# Patient Record
Sex: Female | Born: 1965 | Race: White | Hispanic: No | Smoking: Current every day smoker
Health system: Southern US, Community
[De-identification: ages and names within clinical notes are randomized; demographics above are authoritative.]

## PROBLEM LIST (undated history)

## (undated) DIAGNOSIS — F17200 Nicotine dependence, unspecified, uncomplicated: Secondary | ICD-10-CM

## (undated) DIAGNOSIS — K219 Gastro-esophageal reflux disease without esophagitis: Secondary | ICD-10-CM

## (undated) DIAGNOSIS — IMO0001 Reserved for inherently not codable concepts without codable children: Secondary | ICD-10-CM

## (undated) DIAGNOSIS — F32A Depression, unspecified: Secondary | ICD-10-CM

## (undated) DIAGNOSIS — N6009 Solitary cyst of unspecified breast: Secondary | ICD-10-CM

## (undated) DIAGNOSIS — N870 Mild cervical dysplasia: Secondary | ICD-10-CM

## (undated) DIAGNOSIS — F329 Major depressive disorder, single episode, unspecified: Secondary | ICD-10-CM

## (undated) HISTORY — DX: Reserved for inherently not codable concepts without codable children: IMO0001

## (undated) HISTORY — DX: Solitary cyst of unspecified breast: N60.09

## (undated) HISTORY — DX: Nicotine dependence, unspecified, uncomplicated: F17.200

## (undated) HISTORY — DX: Depression, unspecified: F32.A

## (undated) HISTORY — DX: Gastro-esophageal reflux disease without esophagitis: K21.9

## (undated) HISTORY — PX: BREAST SURGERY: SHX581

## (undated) HISTORY — DX: Major depressive disorder, single episode, unspecified: F32.9

## (undated) HISTORY — DX: Mild cervical dysplasia: N87.0

---

## 1989-10-21 HISTORY — PX: LAPAROSCOPIC CHOLECYSTECTOMY: SUR755

## 1994-10-21 HISTORY — PX: GANGLION CYST EXCISION: SHX1691

## 1998-12-14 ENCOUNTER — Other Ambulatory Visit: Admission: RE | Admit: 1998-12-14 | Discharge: 1998-12-14 | Payer: Self-pay | Admitting: Obstetrics and Gynecology

## 1999-03-29 ENCOUNTER — Ambulatory Visit (HOSPITAL_COMMUNITY): Admission: RE | Admit: 1999-03-29 | Discharge: 1999-03-29 | Payer: Self-pay | Admitting: *Deleted

## 1999-10-22 DIAGNOSIS — N6009 Solitary cyst of unspecified breast: Secondary | ICD-10-CM

## 1999-10-22 HISTORY — DX: Solitary cyst of unspecified breast: N60.09

## 1999-11-27 ENCOUNTER — Other Ambulatory Visit: Admission: RE | Admit: 1999-11-27 | Discharge: 1999-11-27 | Payer: Self-pay | Admitting: Obstetrics and Gynecology

## 2000-02-08 ENCOUNTER — Encounter: Admission: RE | Admit: 2000-02-08 | Discharge: 2000-02-08 | Payer: Self-pay | Admitting: Gynecology

## 2000-02-08 ENCOUNTER — Encounter: Payer: Self-pay | Admitting: Gynecology

## 2000-11-28 ENCOUNTER — Other Ambulatory Visit: Admission: RE | Admit: 2000-11-28 | Discharge: 2000-11-28 | Payer: Self-pay | Admitting: Gynecology

## 2001-02-04 ENCOUNTER — Encounter (INDEPENDENT_AMBULATORY_CARE_PROVIDER_SITE_OTHER): Payer: Self-pay

## 2001-02-04 ENCOUNTER — Other Ambulatory Visit: Admission: RE | Admit: 2001-02-04 | Discharge: 2001-02-04 | Payer: Self-pay | Admitting: Gynecology

## 2001-02-11 ENCOUNTER — Ambulatory Visit (HOSPITAL_COMMUNITY): Admission: RE | Admit: 2001-02-11 | Discharge: 2001-02-11 | Payer: Self-pay | Admitting: Gastroenterology

## 2001-02-17 ENCOUNTER — Ambulatory Visit (HOSPITAL_COMMUNITY): Admission: RE | Admit: 2001-02-17 | Discharge: 2001-02-17 | Payer: Self-pay | Admitting: Gastroenterology

## 2001-02-17 ENCOUNTER — Encounter: Payer: Self-pay | Admitting: Gastroenterology

## 2001-06-17 ENCOUNTER — Other Ambulatory Visit: Admission: RE | Admit: 2001-06-17 | Discharge: 2001-06-17 | Payer: Self-pay | Admitting: Gynecology

## 2001-10-01 ENCOUNTER — Other Ambulatory Visit: Admission: RE | Admit: 2001-10-01 | Discharge: 2001-10-01 | Payer: Self-pay | Admitting: Gynecology

## 2002-01-07 ENCOUNTER — Other Ambulatory Visit: Admission: RE | Admit: 2002-01-07 | Discharge: 2002-01-07 | Payer: Self-pay | Admitting: Gynecology

## 2002-01-11 ENCOUNTER — Encounter: Admission: RE | Admit: 2002-01-11 | Discharge: 2002-01-11 | Payer: Self-pay | Admitting: Gynecology

## 2002-01-11 ENCOUNTER — Encounter: Payer: Self-pay | Admitting: Gynecology

## 2002-05-06 ENCOUNTER — Other Ambulatory Visit: Admission: RE | Admit: 2002-05-06 | Discharge: 2002-05-06 | Payer: Self-pay | Admitting: Gynecology

## 2003-11-01 ENCOUNTER — Other Ambulatory Visit: Admission: RE | Admit: 2003-11-01 | Discharge: 2003-11-01 | Payer: Self-pay | Admitting: Gynecology

## 2003-11-03 ENCOUNTER — Encounter: Admission: RE | Admit: 2003-11-03 | Discharge: 2003-11-03 | Payer: Self-pay | Admitting: Gynecology

## 2004-12-26 ENCOUNTER — Encounter: Admission: RE | Admit: 2004-12-26 | Discharge: 2004-12-26 | Payer: Self-pay | Admitting: Gynecology

## 2004-12-26 ENCOUNTER — Other Ambulatory Visit: Admission: RE | Admit: 2004-12-26 | Discharge: 2004-12-26 | Payer: Self-pay | Admitting: Gynecology

## 2005-07-03 ENCOUNTER — Other Ambulatory Visit: Admission: RE | Admit: 2005-07-03 | Discharge: 2005-07-03 | Payer: Self-pay | Admitting: Gynecology

## 2006-01-02 ENCOUNTER — Other Ambulatory Visit: Admission: RE | Admit: 2006-01-02 | Discharge: 2006-01-02 | Payer: Self-pay | Admitting: Gynecology

## 2007-03-02 ENCOUNTER — Other Ambulatory Visit: Admission: RE | Admit: 2007-03-02 | Discharge: 2007-03-02 | Payer: Self-pay | Admitting: Gynecology

## 2007-11-03 ENCOUNTER — Encounter: Admission: RE | Admit: 2007-11-03 | Discharge: 2007-11-03 | Payer: Self-pay | Admitting: Gynecology

## 2007-11-17 ENCOUNTER — Encounter: Admission: RE | Admit: 2007-11-17 | Discharge: 2007-11-17 | Payer: Self-pay | Admitting: Gynecology

## 2008-03-18 ENCOUNTER — Other Ambulatory Visit: Admission: RE | Admit: 2008-03-18 | Discharge: 2008-03-18 | Payer: Self-pay | Admitting: Gynecology

## 2009-04-19 ENCOUNTER — Encounter: Admission: RE | Admit: 2009-04-19 | Discharge: 2009-04-19 | Payer: Self-pay | Admitting: Gynecology

## 2009-05-03 ENCOUNTER — Ambulatory Visit: Payer: Self-pay | Admitting: Women's Health

## 2009-05-03 ENCOUNTER — Other Ambulatory Visit: Admission: RE | Admit: 2009-05-03 | Discharge: 2009-05-03 | Payer: Self-pay | Admitting: Gynecology

## 2009-05-03 ENCOUNTER — Encounter: Payer: Self-pay | Admitting: Women's Health

## 2010-05-16 ENCOUNTER — Ambulatory Visit: Payer: Self-pay | Admitting: Women's Health

## 2010-05-16 ENCOUNTER — Other Ambulatory Visit: Admission: RE | Admit: 2010-05-16 | Discharge: 2010-05-16 | Payer: Self-pay | Admitting: Gynecology

## 2011-03-08 NOTE — Procedures (Signed)
[place]. [facility]  Patient:    Hannah Proctor, Hannah Proctor                        MRN: [id number] Proc. Date: 02/11/01 Adm. Date:  [id number] Attending:  Charna Elizabeth                           Procedure Report  DATE OF BIRTH:  1966-05-26  REFERRING PHYSICIAN:  PROCEDURE PERFORMED:  Esophagogastroduodenoscopy.  ENDOSCOPIST:  Anselmo Rod, M.D.  INSTRUMENT USED:  Olympus video panendoscope.  INDICATIONS FOR PROCEDURE:  The patient is a 45 year old white female with a history of severe reflux, chest pain, epigastric discomfort with worsening of her symptoms the last several months rule out peptic ulcer disease.  The patient is on Prilosec on a regular basis but has intermittent abdominal pain. The patient had a laparoscopic cholecystectomy by Angelia Mould. Derrell Lolling, M.D. in the past.  PREPROCEDURE PREPARATION:  Informed consent was procured from the patient. The patient was fasted for eight hours prior to the procedure.  PREPROCEDURE PHYSICAL:  The patient had stable vital signs.  Neck supple. Chest clear to auscultation.  S1, S2 regular.  Abdomen soft with normal abdominal bowel sounds.  Epigastric tenderness on palpation with guarding, no rebound, no rigidity, no hepatosplenomegaly.  DESCRIPTION OF PROCEDURE:  The patient was placed in left lateral decubitus position and sedated with 70 mg of Demerol and 7 mg of Versed intravenously. Once the patient was adequately sedated and maintained on low-flow oxygen and continuous cardiac monitoring, the Olympus video panendoscope was advanced through the mouthpiece, over the tongue, into the esophagus under direct vision.  The entire esophagus appeared normal without evidence of ring, stricture, masses, lesions, esophagitis or Barretts mucosa.  The scope was then advanced to the stomach.  The entire gastric mucosa and the proximal small bowel appeared normal.  IMPRESSION:  Normal  esophagogastroduodenoscopy.  RECOMMENDATION:  Proceed with a flexible sigmoidoscopy at this time.  Further recommendation made thereafter. DD:  02/11/01 TD:  02/11/01 Job: [id number]

## 2011-03-08 NOTE — Procedures (Signed)
[place]  Patient:    Hannah Proctor, Hannah Proctor                        MRN: [id number] Proc. Date: 02/11/01 Adm. Date:  [id number] Attending:  Charna Elizabeth                           Procedure Report  DATE OF BIRTH:  1966/07/25  REFERRING PHYSICIAN:  PROCEDURE PERFORMED:  Flexible sigmoidoscopy.  ENDOSCOPIST:  Anselmo Rod, M.D.  INSTRUMENT USED:  Olympus video colonoscope.  INDICATIONS FOR PROCEDURE:  Abnormal rectal exam on recent physical, rule out rectal masses, polyps etc.  PREPROCEDURE PREPARATION:  Informed consent was procured from the patient. The patient was fasted for eight hours prior to the procedure and prepped with two Fleets enemas the morning of the procedure.  PREPROCEDURE PHYSICAL:  The patient had stable vital signs.  Neck supple. Chest clear to auscultation.  S1, S2 regular.  Abdomen soft with epigastric tenderness on palpation.  No guarding, no rebound, no rigidity.  Rectal examination was repeated and the mass was palpated again at the tip of the examining finger.  DESCRIPTION OF PROCEDURE:  The patient was placed in the left lateral decubitus position and no additional sedation was used.  Once the patient was adequately sedated and maintained on low-flow oxygen and continuous cardiac monitoring, the Olympus video panendoscope was advanced from the rectum to 50 cm without difficulty.  No masses, polyps, erosions, ulcerations or diverticula were seen.  The patient had small external hemorrhoids and tolerated the procedure well without complication.  IMPRESSION: 1. Extrinsic compression of the rectum. 2. Small external hemorrhoid.  RECOMMENDATIONS:  A CT scan of the abdomen and pelvis will be done to work-up the patients abdominal complaints and abnormal rectal findings.  Further recommendation will be made thereafter.DD:  02/11/01 TD:  02/11/01 Job: [id number]

## 2011-05-23 ENCOUNTER — Encounter: Payer: Self-pay | Admitting: Women's Health

## 2011-05-23 ENCOUNTER — Other Ambulatory Visit (HOSPITAL_COMMUNITY)
Admission: RE | Admit: 2011-05-23 | Discharge: 2011-05-23 | Disposition: A | Discharge status: Home / Self Care | Payer: BC Managed Care – PPO | Source: Ambulatory Visit | Attending: Women's Health | Admitting: Women's Health

## 2011-05-23 ENCOUNTER — Ambulatory Visit (INDEPENDENT_AMBULATORY_CARE_PROVIDER_SITE_OTHER): Payer: Self-pay | Admitting: Women's Health

## 2011-05-23 DIAGNOSIS — Z01419 Encounter for gynecological examination (general) (routine) without abnormal findings: Secondary | ICD-10-CM

## 2011-05-23 DIAGNOSIS — A499 Bacterial infection, unspecified: Secondary | ICD-10-CM

## 2011-05-23 DIAGNOSIS — F419 Anxiety disorder, unspecified: Secondary | ICD-10-CM

## 2011-05-23 DIAGNOSIS — F411 Generalized anxiety disorder: Secondary | ICD-10-CM

## 2011-05-23 DIAGNOSIS — N760 Acute vaginitis: Secondary | ICD-10-CM

## 2011-05-23 DIAGNOSIS — F329 Major depressive disorder, single episode, unspecified: Secondary | ICD-10-CM

## 2011-05-23 MED ORDER — ALPRAZOLAM 0.25 MG PO TABS: 0.2500 mg | ORAL_TABLET | Freq: Every evening | ORAL | Status: AC | PRN

## 2011-05-23 MED ORDER — METRONIDAZOLE 0.75 % VA GEL: 1.0000 | Freq: Two times a day (BID) | VAGINAL | Status: AC

## 2011-05-23 MED ORDER — BUPROPION HCL ER (XL) 150 MG PO TB24: 150.0000 mg | ORAL_TABLET | Freq: Every day | ORAL | Status: DC

## 2011-05-23 NOTE — Progress Notes (Addendum)
Hannah Proctor 07-29-1966 [id number]    History:    The patient presents for annual exam. 45 yo DWF G5P2 BTL having most monthly cycles for 5-7 days. Has had some cycles as close to 21 days. She states the last year her cycles have become a little less predictable but they are at least 21 days from day one to day 1. She still struggles with some anxiety and depression issues. She is currently on Wellbutrin 150 XL daily and wishes to continue. She uses an occasional Xanax 0.25, status only used one prescription in the last year. She is a smoker and she is down to about 15 cigarettes a day and is aware of the hazards of smoking in relationship to health Adopted unknown family hx.  Past medical history, past surgical history, family history and social history were all reviewed and documented in the EPIC chart.   ROS:  A 14 point ROS was performed and pertinent positives and negatives are included in the history.  Exam:  Filed Vitals:   05/23/11 0921  BP: 120/70    General appearance:  Normal Head/Neck:  Normal, without cervical or supraclavicular adenopathy. Thyroid:  Symmetrical, normal in size, without palpable masses or nodularity. Respiratory  Effort:  Normal  Auscultation:  Clear without wheezing or rhonchi Cardiovascular  Auscultation:  Regular rate, without rubs, murmurs or gallops  Edema/varicosities:  Not grossly evident Abdominal  Masses/tenderness:  Soft,nontender, without masses, guarding or rebound.  Liver/spleen:  No organomegaly noted  Hernia:  None appreciated  Occult test:   Skin  Inspection:  Grossly normal  Palpation:  Grossly normal Neurologic/psychiatric  Orientation:  Normal with appropriate conversation.  Mood/affect:  Normal  Genitourinary    Breasts: Examined lying and sitting.     Right: Without masses, retractions, discharge or axillary adenopathy.     Left: Without masses, retractions, discharge or axillary adenopathy.   Inguinal/mons:  Normal  without inguinal adenopathy  External genitalia:  Normal  BUS/Urethra/Skene's glands:  Normal  Bladder:  Normal  Vagina:  Normal  Cervix:  Normal-menses present  Uterus:  normal in size, shape and contour.  Midline and mobile  Adnexa/parametria:     Rt: Without masses or tenderness.   Lt: Without masses or tenderness.  Anus and perineum: Normal  Digital rectal exam: Normal sphincter tone without palpated masses or tenderness  Assessment/Plan:  45 y.o. year old female for annual exam.   SBE is, yearly mammogram which is overdue, status we'll schedule. Reviewed importance of quitting cutting back cigarettes, and she is aware. Calcium rich diet encourage, vitamin D 2000 daily encouraged she states she had labs at work -   Cholesterol, glucose, CBC she states they were all normal. Will have these  faxed to our office. Pap only today. Reviewed if cycles became closer than 21 days between to call or return to the office. Reviewed importance of leisure, is dealing with 2 teenage sons. Will continue on Wellbutrin 150 prescription was given, and prescription for Xanax 0.25 to use as needed for rest. She is aware of addictive properties of Xanax.  She has a history of BV in the past, she states it has been better. States she does get vaginal odor after her cycle.She has used MetroGel in the past and would like a prescription to use after her cycle - one applicator at bedtime after cycle. Prescription was given and reviewed to call if this does not prevent or help  the problem.    Keishawn Rajewski JNP,  10:32  AM 05/23/2011

## 2011-09-21 ENCOUNTER — Ambulatory Visit (HOSPITAL_COMMUNITY)
Admission: RE | Admit: 2011-09-21 | Discharge: 2011-09-21 | Disposition: A | Discharge status: Home / Self Care | Payer: BC Managed Care – PPO | Source: Ambulatory Visit | Attending: Family Medicine | Admitting: Family Medicine

## 2011-09-21 ENCOUNTER — Other Ambulatory Visit: Payer: Self-pay | Admitting: Family Medicine

## 2011-09-21 DIAGNOSIS — T1490XA Injury, unspecified, initial encounter: Secondary | ICD-10-CM

## 2011-09-21 DIAGNOSIS — W19XXXA Unspecified fall, initial encounter: Secondary | ICD-10-CM | POA: Insufficient documentation

## 2011-09-21 DIAGNOSIS — R220 Localized swelling, mass and lump, head: Secondary | ICD-10-CM | POA: Insufficient documentation

## 2011-09-21 DIAGNOSIS — S0510XA Contusion of eyeball and orbital tissues, unspecified eye, initial encounter: Secondary | ICD-10-CM | POA: Insufficient documentation

## 2011-09-21 DIAGNOSIS — R42 Dizziness and giddiness: Secondary | ICD-10-CM | POA: Insufficient documentation

## 2011-09-27 ENCOUNTER — Ambulatory Visit (INDEPENDENT_AMBULATORY_CARE_PROVIDER_SITE_OTHER): Payer: BC Managed Care – PPO

## 2011-09-27 DIAGNOSIS — Z4802 Encounter for removal of sutures: Secondary | ICD-10-CM

## 2011-12-13 ENCOUNTER — Encounter: Payer: Self-pay | Admitting: Women's Health

## 2011-12-13 ENCOUNTER — Ambulatory Visit (INDEPENDENT_AMBULATORY_CARE_PROVIDER_SITE_OTHER): Payer: BC Managed Care – PPO | Admitting: Women's Health

## 2011-12-13 ENCOUNTER — Other Ambulatory Visit: Payer: Self-pay | Admitting: Women's Health

## 2011-12-13 DIAGNOSIS — R300 Dysuria: Secondary | ICD-10-CM

## 2011-12-13 DIAGNOSIS — N899 Noninflammatory disorder of vagina, unspecified: Secondary | ICD-10-CM

## 2011-12-13 DIAGNOSIS — N898 Other specified noninflammatory disorders of vagina: Secondary | ICD-10-CM

## 2011-12-13 LAB — URINALYSIS W MICROSCOPIC + REFLEX CULTURE
Bilirubin Urine: NEGATIVE
Casts: NONE SEEN
Glucose, UA: NEGATIVE mg/dL
Leukocytes, UA: NEGATIVE
Urobilinogen, UA: 0.2 mg/dL (ref 0.0–1.0)

## 2011-12-13 LAB — WET PREP FOR TRICH, YEAST, CLUE
Trich, Wet Prep: NONE SEEN
WBC, Wet Prep HPF POC: NONE SEEN
Yeast Wet Prep HPF POC: NONE SEEN

## 2011-12-13 MED ORDER — METRONIDAZOLE 0.75 % VA GEL: VAGINAL | Status: AC

## 2011-12-13 MED ORDER — NITROFURANTOIN MONOHYD MACRO 100 MG PO CAPS: 100.0000 mg | ORAL_CAPSULE | Freq: Two times a day (BID) | ORAL | Status: AC

## 2011-12-13 NOTE — Patient Instructions (Signed)
Bacterial Vaginosis Bacterial vaginosis (BV) is a vaginal infection where the normal balance of bacteria in the vagina is disrupted. The normal balance is then replaced by an overgrowth of certain bacteria. There are several different kinds of bacteria that can cause BV. BV is the most common vaginal infection in women of childbearing age. CAUSES   The cause of BV is not fully understood. BV develops when there is an increase or imbalance of harmful bacteria.   Some activities or behaviors can upset the normal balance of bacteria in the vagina and put women at increased risk including:   Having a new sex partner or multiple sex partners.   Douching.   Using an intrauterine device (IUD) for contraception.   It is not clear what role sexual activity plays in the development of BV. However, women that have never had sexual intercourse are rarely infected with BV.  Women do not get BV from toilet seats, bedding, swimming pools or from touching objects around them.  SYMPTOMS   Grey vaginal discharge.   A fish-like odor with discharge, especially after sexual intercourse.   Itching or burning of the vagina and vulva.   Burning or pain with urination.   Some women have no signs or symptoms at all.  DIAGNOSIS  Your caregiver must examine the vagina for signs of BV. Your caregiver will perform lab tests and look at the sample of vaginal fluid through a microscope. They will look for bacteria and abnormal cells (clue cells), a pH test higher than 4.5, and a positive amine test all associated with BV.  RISKS AND COMPLICATIONS   Pelvic inflammatory disease (PID).   Infections following gynecology surgery.   Developing HIV.   Developing herpes virus.  TREATMENT  Sometimes BV will clear up without treatment. However, all women with symptoms of BV should be treated to avoid complications, especially if gynecology surgery is planned. Female partners generally do not need to be treated. However,  BV may spread between female sex partners so treatment is helpful in preventing a recurrence of BV.   BV may be treated with antibiotics. The antibiotics come in either pill or vaginal cream forms. Either can be used with nonpregnant or pregnant women, but the recommended dosages differ. These antibiotics are not harmful to the baby.   BV can recur after treatment. If this happens, a second round of antibiotics will often be prescribed.   Treatment is important for pregnant women. If not treated, BV can cause a premature delivery, especially for a pregnant woman who had a premature birth in the past. All pregnant women who have symptoms of BV should be checked and treated.   For chronic reoccurrence of BV, treatment with a type of prescribed gel vaginally twice a week is helpful.  HOME CARE INSTRUCTIONS   Finish all medication as directed by your caregiver.   Do not have sex until treatment is completed.   Tell your sexual partner that you have a vaginal infection. They should see their caregiver and be treated if they have problems, such as a mild rash or itching.   Practice safe sex. Use condoms. Only have 1 sex partner.  PREVENTION  Basic prevention steps can help reduce the risk of upsetting the natural balance of bacteria in the vagina and developing BV:  Do not have sexual intercourse (be abstinent).   Do not douche.   Use all of the medicine prescribed for treatment of BV, even if the signs and symptoms go away.     Tell your sex partner if you have BV. That way, they can be treated, if needed, to prevent reoccurrence.  SEEK MEDICAL CARE IF:   Your symptoms are not improving after 3 days of treatment.   You have increased discharge, pain, or fever.  MAKE SURE YOU:   Understand these instructions.   Will watch your condition.   Will get help right away if you are not doing well or get worse.  FOR MORE INFORMATION  Division of STD Prevention (DSTDP), Centers for Disease  Control and Prevention: www.cdc.gov/std American Social Health Association (ASHA): www.ashastd.org  Document Released: 10/07/2005 Document Revised: 06/19/2011 Document Reviewed: 03/30/2009 ExitCare Patient Information 2012 [employer].Urinary Tract Infection Infections of the urinary tract can start in several places. A bladder infection (cystitis), a kidney infection (pyelonephritis), and a prostate infection (prostatitis) are different types of urinary tract infections (UTIs). They usually get better if treated with medicines (antibiotics) that kill germs. Take all the medicine until it is gone. You or your child may feel better in a few days, but TAKE ALL MEDICINE or the infection may not respond and may become more difficult to treat. HOME CARE INSTRUCTIONS   Drink enough water and fluids to keep the urine clear or pale yellow. Cranberry juice is especially recommended, in addition to large amounts of water.   Avoid caffeine, tea, and carbonated beverages. They tend to irritate the bladder.   Alcohol may irritate the prostate.   Only take over-the-counter or prescription medicines for pain, discomfort, or fever as directed by your caregiver.  To prevent further infections:  Empty the bladder often. Avoid holding urine for long periods of time.   After a bowel movement, women should cleanse from front to back. Use each tissue only once.   Empty the bladder before and after sexual intercourse.  FINDING OUT THE RESULTS OF YOUR TEST Not all test results are available during your visit. If your or your child's test results are not back during the visit, make an appointment with your caregiver to find out the results. Do not assume everything is normal if you have not heard from your caregiver or the medical facility. It is important for you to follow up on all test results. SEEK MEDICAL CARE IF:   There is back pain.   Your baby is older than 3 months with a rectal temperature of 100.5  F (38.1 C) or higher for more than 1 day.   Your or your child's problems (symptoms) are no better in 3 days. Return sooner if you or your child is getting worse.  SEEK IMMEDIATE MEDICAL CARE IF:   There is severe back pain or lower abdominal pain.   You or your child develops chills.   You have a fever.   Your baby is older than 3 months with a rectal temperature of 102 F (38.9 C) or higher.   Your baby is 3 months old or younger with a rectal temperature of 100.4 F (38 C) or higher.   There is nausea or vomiting.   There is continued burning or discomfort with urination.  MAKE SURE YOU:   Understand these instructions.   Will watch your condition.   Will get help right away if you are not doing well or get worse.  Document Released: 07/17/2005 Document Revised: 06/19/2011 Document Reviewed: 02/19/2007 ExitCare Patient Information 2012 [employer]. 

## 2011-12-13 NOTE — Progress Notes (Signed)
Patient ID: Hannah Proctor, female   DOB: 20-Mar-1966, 46 y.o.   MRN: [id number] Presents with a complaint of vaginal discharge with odor, especially after intercourse. Pain, burning, at end of stream of urination. Increased urinary urgency and frequency. Denies fever. Same partner. BTL.  Exam: UA 3-6 rbc's, 0-2 WBCs +1 bacteria. No CVAT, abdomen soft, no rebound or radiation of pain. External genitalia slightly erythematous at introitus, speculum exam scant discharge no erythema, positive for odor. Wet prep positive for amines, clue cells, and yeast. Bimanual no CMT or adnexal fullness or tenderness.  BV and yeast UTI  Plan: MetroGel vaginal cream 1 applicator at bedtime x5, Diflucan 150 by mouth x1 dose with refill. Macrobid 1 by mouth twice a day for 7 days instructed to take with food. Urine culture pending. UTI prevention discussed. Call if no relief of symptoms.

## 2011-12-16 LAB — URINE CULTURE

## 2011-12-19 ENCOUNTER — Ambulatory Visit: Payer: BC Managed Care – PPO

## 2011-12-19 ENCOUNTER — Ambulatory Visit (INDEPENDENT_AMBULATORY_CARE_PROVIDER_SITE_OTHER): Payer: BC Managed Care – PPO | Admitting: Family Medicine

## 2011-12-19 VITALS — BP 118/65 | HR 80 | Temp 98.1°F | Resp 16 | Ht 65.0 in | Wt 150.0 lb

## 2011-12-19 DIAGNOSIS — R0989 Other specified symptoms and signs involving the circulatory and respiratory systems: Secondary | ICD-10-CM

## 2011-12-19 DIAGNOSIS — R509 Fever, unspecified: Secondary | ICD-10-CM

## 2011-12-19 DIAGNOSIS — R059 Cough, unspecified: Secondary | ICD-10-CM

## 2011-12-19 DIAGNOSIS — R0602 Shortness of breath: Secondary | ICD-10-CM

## 2011-12-19 DIAGNOSIS — R05 Cough: Secondary | ICD-10-CM

## 2011-12-19 DIAGNOSIS — R079 Chest pain, unspecified: Secondary | ICD-10-CM

## 2011-12-19 MED ORDER — DOXYCYCLINE HYCLATE 100 MG PO TABS: 100.0000 mg | ORAL_TABLET | Freq: Two times a day (BID) | ORAL | Status: AC

## 2011-12-19 MED ORDER — ALBUTEROL SULFATE (2.5 MG/3ML) 0.083% IN NEBU
2.5000 mg | INHALATION_SOLUTION | Freq: Once | RESPIRATORY_TRACT | Status: AC
  Administered 2011-12-19: 2.5 mg via RESPIRATORY_TRACT

## 2011-12-19 MED ORDER — IPRATROPIUM BROMIDE 0.02 % IN SOLN
0.5000 mg | Freq: Once | RESPIRATORY_TRACT | Status: AC
  Administered 2011-12-19: 0.5 mg via RESPIRATORY_TRACT

## 2011-12-19 MED ORDER — IPRATROPIUM-ALBUTEROL 18-103 MCG/ACT IN AERO: 2.0000 | INHALATION_SPRAY | Freq: Four times a day (QID) | RESPIRATORY_TRACT | Status: DC | PRN

## 2011-12-19 NOTE — Progress Notes (Signed)
[facility]:  Office Visit  Chief Complaint:  Chief Complaint  Patient presents with  . Fever    saturday  . Chest Pain    tightness, uri sxs  . Shortness of Breath    HPI: Hannah Proctor is a 46 y.o. female who complains of 5 day history of non-productive cough, fever, chills, + smoker, no Wheeze, no msk pain. Tried OTC meds without relief  Past Medical History  Diagnosis Date  . CIN I (cervical intraepithelial neoplasia I)   . Breast cyst 2001  . Reflux   . Depression   . Smoker    Past Surgical History  Procedure Date  . Laparoscopic cholecystectomy 1991  . Cesarean section 1997  . Ganglion cyst excision 1996   History   Social History  . Marital Status: Divorced    Spouse Name: N/A    Number of Children: N/A  . Years of Education: N/A   Social History Main Topics  . Smoking status: Current Everyday Smoker -- 1.0 packs/day for 30 years    Types: Cigarettes  . Smokeless tobacco: Never Used  . Alcohol Use: Yes  . Drug Use: No  . Sexually Active: None   Other Topics Concern  . None   Social History Narrative  . None   Family History  Problem Relation Age of Onset  . Adopted: Yes   Allergies  Allergen Reactions  . Codeine   . Penicillins    Prior to Admission medications   Medication Sig Start Date End Date Taking? Authorizing Provider  ALPRAZolam (XANAX PO) Take 25 mg by mouth as needed.   Yes Historical Provider, MD  buPROPion (WELLBUTRIN XL) 150 MG 24 hr tablet Take 1 tablet (150 mg total) by mouth daily. 05/23/11  Yes Harrington Challenger, NP  CALCIUM PO Take by mouth.     Yes Historical Provider, MD  metroNIDAZOLE (METROGEL VAGINAL) 0.75 % vaginal gel 1 applicator per vagina at HS x 5 12/13/11 12/20/11 Yes Harrington Challenger, NP  nitrofurantoin, macrocrystal-monohydrate, (MACROBID) 100 MG capsule Take 1 capsule (100 mg total) by mouth 2 (two) times daily. 12/13/11 12/20/11 Yes Harrington Challenger, NP  Omeprazole (PRILOSEC PO) Take by mouth.     Yes  Historical Provider, MD  OVER THE COUNTER MEDICATION     Yes Historical Provider, MD  Probiotic Product (PRO-BIOTIC BLEND) CAPS Take by mouth.     Yes Historical Provider, MD  ranitidine (ZANTAC) 75 MG tablet Take 75 mg by mouth once.     Yes Historical Provider, MD     ROS: The patient denies fevers, chills, night sweats, unintentional weight loss, chest pain, palpitations, wheezing, dyspnea on exertion, nausea, vomiting, abdominal pain, dysuria, hematuria, melena, numbness, weakness, or tingling.+ chest tightness, cough  All other systems have been reviewed and were otherwise negative with the exception of those mentioned in the HPI and as above.    PHYSICAL EXAM: Filed Vitals:   12/19/11 1519  BP: 118/65  Pulse: 80  Temp: 98.1 F (36.7 C)  Resp: 16   Filed Vitals:   12/19/11 1519  Height: 5\' 5"  (1.651 m)  Weight: 150 lb (68.04 kg)   Spo2 97%  Body mass index is 24.96 kg/(m^2).  General: Alert, no acute distress HEENT:  Normocephalic, atraumatic, oropharynx patent.  Cardiovascular:  Regular rate and rhythm, no rubs murmurs or gallops.  No Carotid bruits, radial pulse intact. No pedal edema.  Respiratory: Clear to auscultation bilaterally.  No wheezes, rales, or  rhonchi.  No cyanosis, no use of accessory musculature + decrease air movement, decrease BS,  GI: No organomegaly, abdomen is soft and non-tender, positive bowel sounds.  No masses. Skin: + rash, erythematous macular papular on arms, nonpruritic ( started after she drank alcohol last night while on macrobid-1 day of macrobid left)  Neurologic: Facial musculature symmetric. Psychiatric: Patient is appropriate throughout our interaction. Lymphatic: No cervical lymphadenopathy Musculoskeletal: Gait intact.   LABS:  EKG/XRAY:   Primary read interpreted by Dr. Conley Rolls at [facility]. Right midlobe infiltrate suspicious for PNA.    ASSESSMENT/PLAN: Encounter Diagnoses  Name Primary?  . Cough Yes  . Fever   . Chest congestion     Suspicious for PNA based on sxs and xray reading- Doxycyclin 100 mg BID x 10 days, C/w Mucinex, Combivent Inhaler. F/u prn or if no improvement/worsening sxs in 48-72 hours.  Patient received a neb treatment with Albuterol and Atrovent in office, she felt beter and on exam her WOB was improved, chest motion was not as tight and more audible BS.    Juliette Standre PHUONG, DO 12/19/2011 4:09 PM

## 2012-01-02 ENCOUNTER — Telehealth: Payer: Self-pay | Admitting: Family Medicine

## 2012-01-02 NOTE — Telephone Encounter (Signed)
Called patient on 12/29/11 to ask her to come in for repeat xray in 1-2 weeks. Radiology had recommended repeat xray.

## 2012-06-05 IMAGING — CT CT HEAD W/O CM
3 of 5 series · 16 of 47 positions shown, 19 images · non-contrast
Comparison: None.

CT HEAD

CLINICAL DATA: Fell yesterday, dizziness, periorbital hematoma.

CT HEAD WITHOUT CONTRAST
CT MAXILLOFACIAL WITHOUT CONTRAST
TECHNIQUE: Multidetector CT imaging of the head and maxillofacial
structures were performed using the standard protocol without
intravenous contrast. Multiplanar CT image reconstructions of the
maxillofacial structures were also generated.

[Series 602: coronal st · coronal · 0.30mm/px · 1 of 61 slices shown]
[im 31/61  brain]
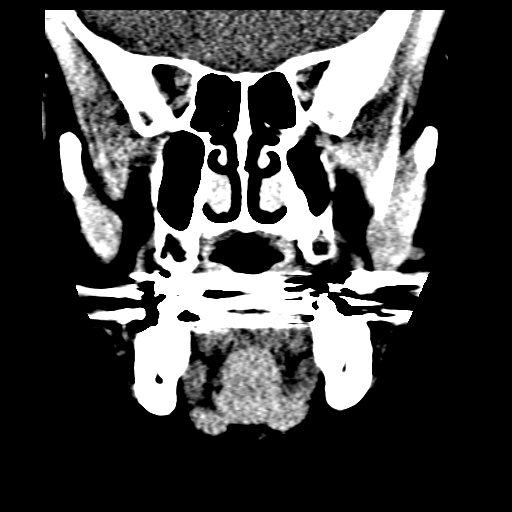

[Series 603: sagittal st · sagittal · 0.30mm/px · 3 of 73 slices shown]
[im 25/73  brain]
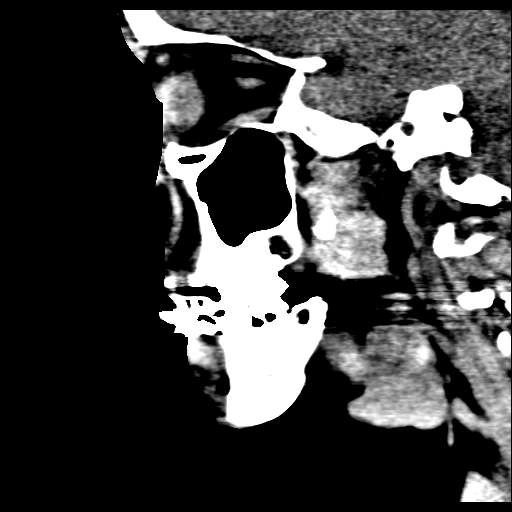
[im 37/73  brain]
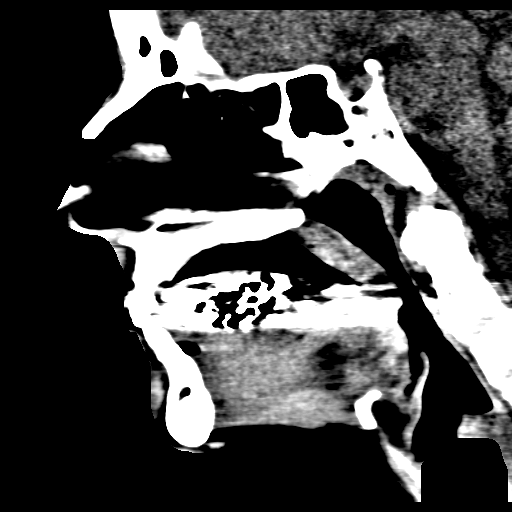
[im 49/73  brain]
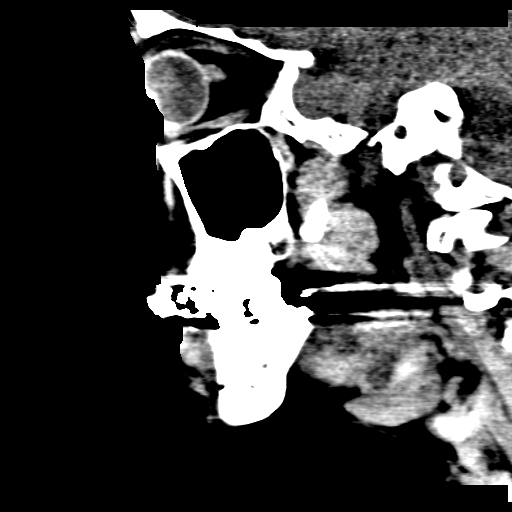

[Series 604: axial st · axial · 0.30mm/px · z∈[-324,-197]mm · 12 of 140 slices shown, 15 images]
[im 7/140  brain]
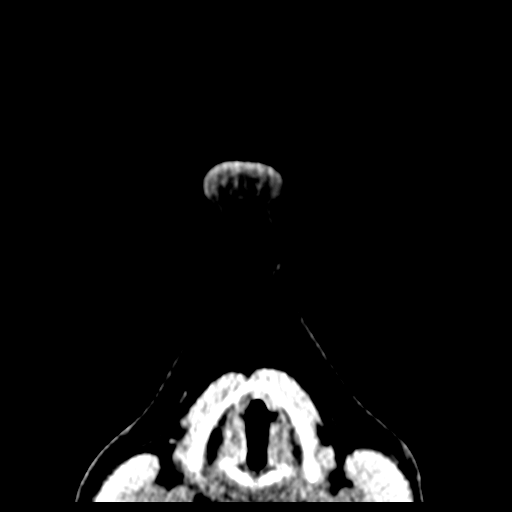
[im 7/140  bone]
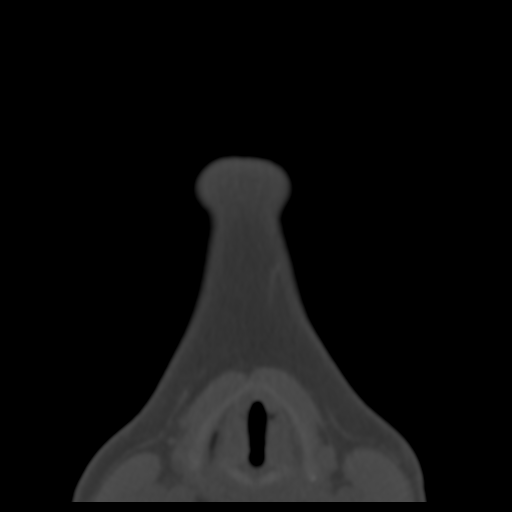
[im 19/140  brain]
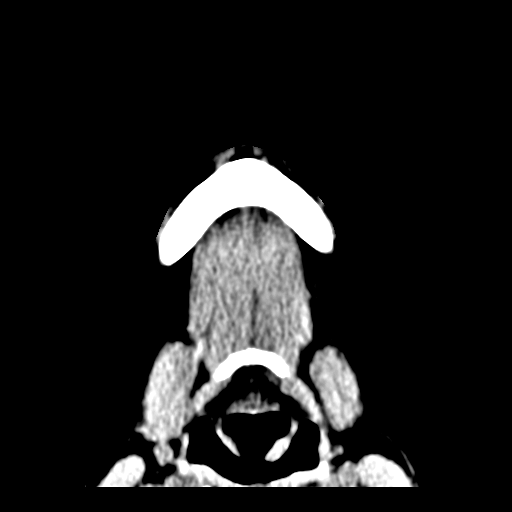
[im 31/140  brain]
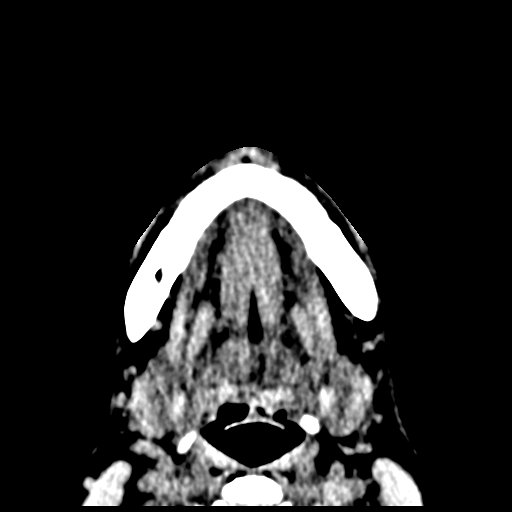
[im 43/140  brain]
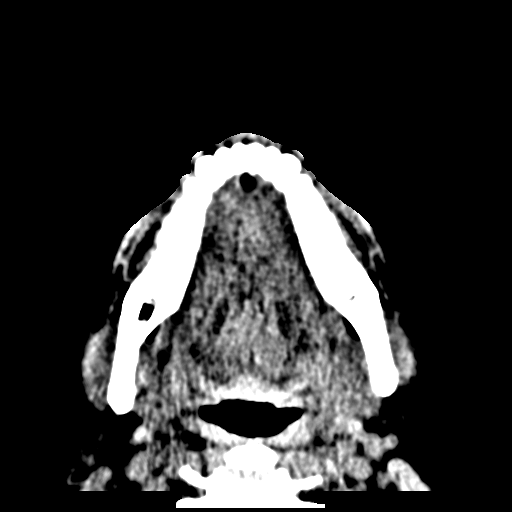
[im 55/140  brain]
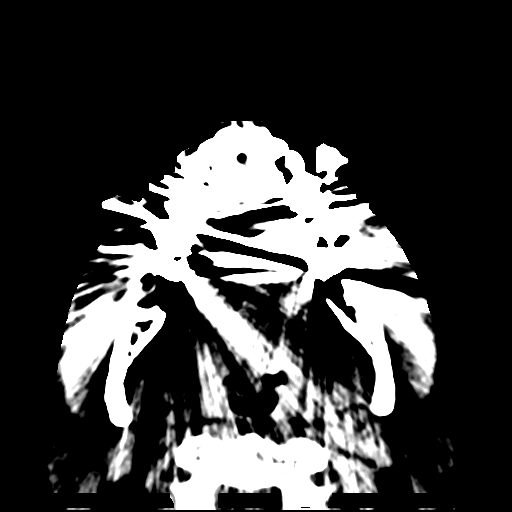
[im 55/140  bone]
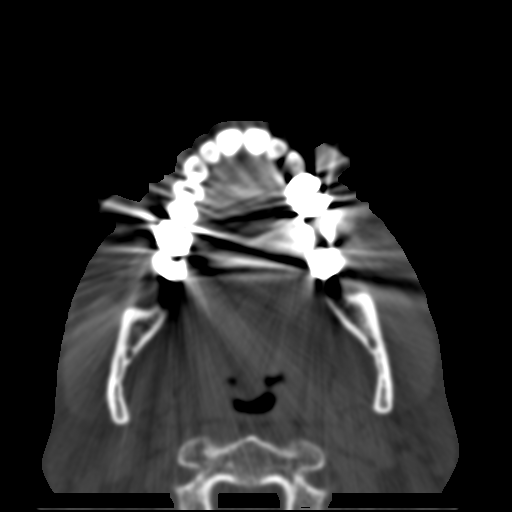
[im 67/140  brain]
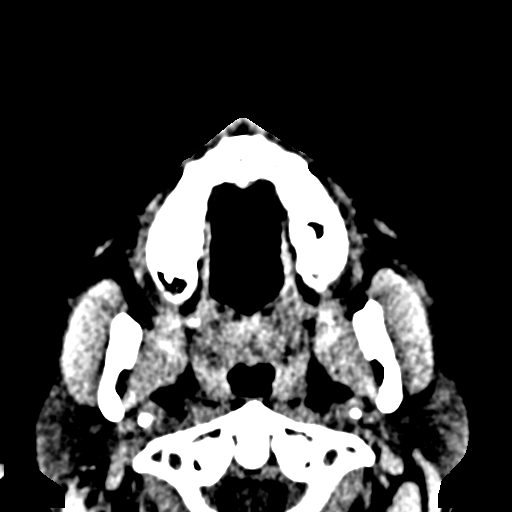
[im 73/140  brain]
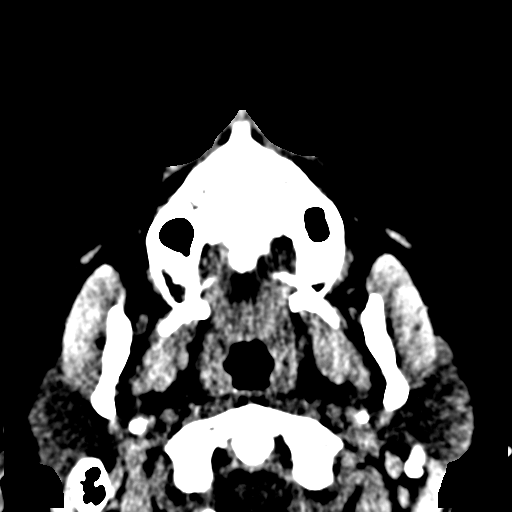
[im 85/140  brain]
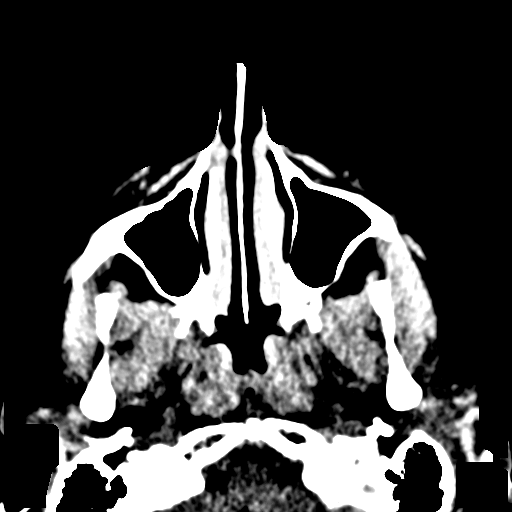
[im 97/140  brain]
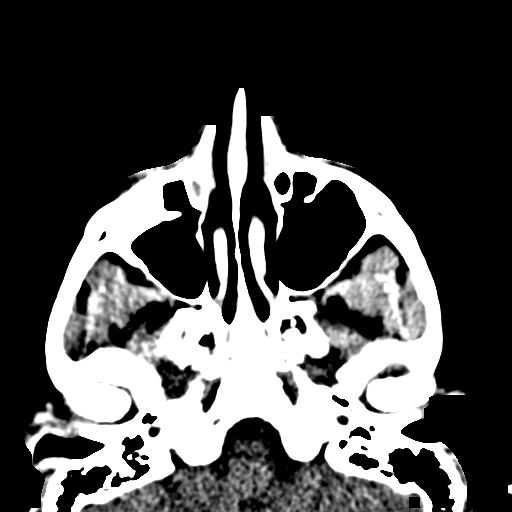
[im 97/140  bone]
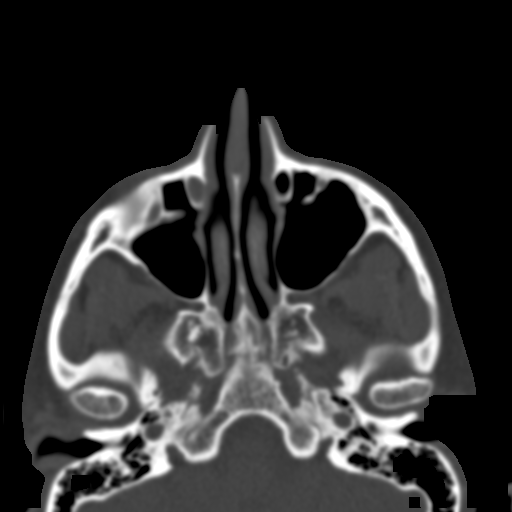
[im 109/140  brain]
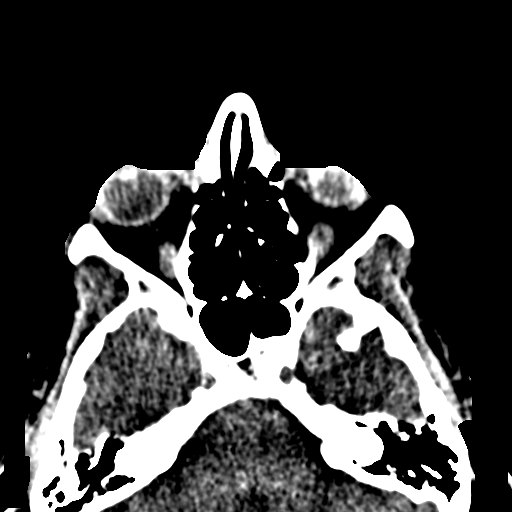
[im 121/140  brain]
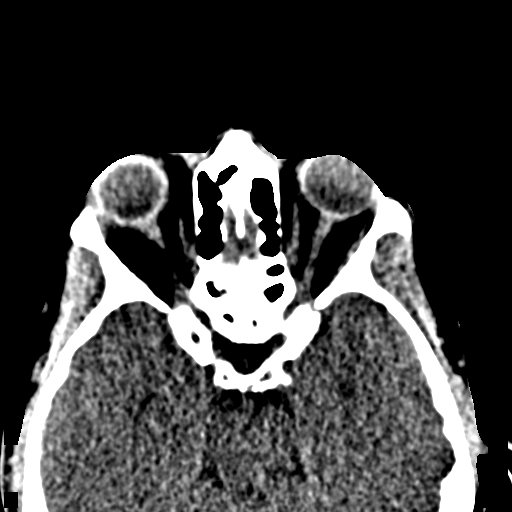
[im 133/140  brain]
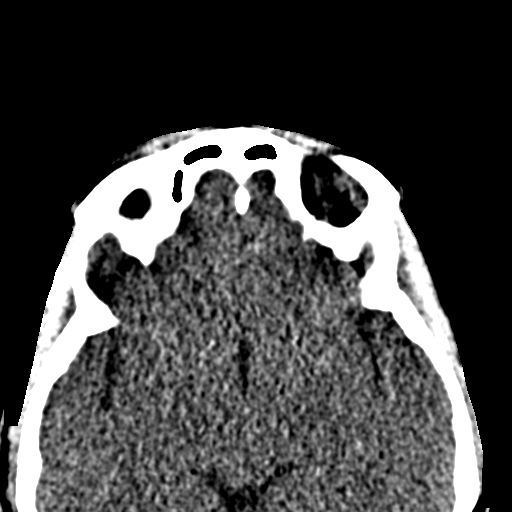

[16 of 47 positions shown; findings below may reference images not displayed]

FINDINGS: There is no evidence for acute infarction, intracranial
hemorrhage, mass lesion, hydrocephalus, or extra-axial fluid.
There is no atrophy or white matter disease.  Calvarium is intact.
The paranasal sinuses and mastoids are clear.
IMPRESSION: Negative.

CT MAXILLOFACIAL
FINDINGS: There is mild preseptal periorbital soft tissue
swelling on the right extending into the right malar region.  There
is no visible facial fracture.  Specifically, with regard to the
right orbit, there is no blowout fracture.  There is no air-fluid
level in the ethmoid or maxillary sinuses.  The sphenoid sinus is
clear.  The nasal bones and nasal septum are intact.  There is no
zygomatic, maxillary or mandibular fracture.  Temporomandibular
joints are located. There is no visible radiopaque foreign body.
There are no visible loose or missing teeth.

Both globes are symmetric.  There is no retrobulbar hemorrhage.
There is no proptosis.  The lens appears normally located
bilaterally.
IMPRESSION: Mild right preseptal periorbital soft tissue swelling without post-
septal orbital hematoma, blowout fracture, disruption of the globe,
or other significant facial injury.  Mild right malar hematoma. No
facial fracture.

## 2012-07-28 ENCOUNTER — Other Ambulatory Visit: Payer: Self-pay | Admitting: Women's Health

## 2012-07-28 DIAGNOSIS — F329 Major depressive disorder, single episode, unspecified: Secondary | ICD-10-CM

## 2012-07-29 NOTE — Telephone Encounter (Signed)
Pt. is due for AEX. Will have front call to get that scheduled.

## 2012-07-29 NOTE — Telephone Encounter (Signed)
Please have patient schedule annual exam.

## 2012-07-29 NOTE — Telephone Encounter (Signed)
Message left to schedule annual exam and rx called in

## 2012-08-05 ENCOUNTER — Ambulatory Visit (INDEPENDENT_AMBULATORY_CARE_PROVIDER_SITE_OTHER): Payer: BC Managed Care – PPO | Admitting: Women's Health

## 2012-08-05 ENCOUNTER — Encounter: Payer: Self-pay | Admitting: Women's Health

## 2012-08-05 VITALS — BP 132/88 | Ht 65.5 in | Wt 141.0 lb

## 2012-08-05 DIAGNOSIS — F3289 Other specified depressive episodes: Secondary | ICD-10-CM

## 2012-08-05 DIAGNOSIS — Z01419 Encounter for gynecological examination (general) (routine) without abnormal findings: Secondary | ICD-10-CM

## 2012-08-05 DIAGNOSIS — F329 Major depressive disorder, single episode, unspecified: Secondary | ICD-10-CM

## 2012-08-05 DIAGNOSIS — Z113 Encounter for screening for infections with a predominantly sexual mode of transmission: Secondary | ICD-10-CM

## 2012-08-05 DIAGNOSIS — F17210 Nicotine dependence, cigarettes, uncomplicated: Secondary | ICD-10-CM

## 2012-08-05 DIAGNOSIS — F17200 Nicotine dependence, unspecified, uncomplicated: Secondary | ICD-10-CM

## 2012-08-05 MED ORDER — ALPRAZOLAM 0.25 MG PO TABS: 0.2500 mg | ORAL_TABLET | Freq: Every evening | ORAL | Status: DC | PRN

## 2012-08-05 MED ORDER — BUPROPION HCL ER (XL) 150 MG PO TB24: 150.0000 mg | ORAL_TABLET | Freq: Every morning | ORAL | Status: DC

## 2012-08-05 NOTE — Progress Notes (Signed)
Hannah Proctor 1966-06-13 [id number]    History:    The patient presents for annual exam.  Brought labs performed at work CBC, lipid panel, liver function tests, TSH all normal, hemoglobin A1c 5.1, FSH 68 has been amenorrheic for 2 months. 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 History of breast cysts, overdue for mammogram, last mammogram 2010. BTL. Smokes one pack per day. History of depression, stable on Wellbutrin.  Past medical history, past surgical history, family history and social history were all reviewed and documented in the EPIC chart. 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 Sons [name] 16, doing well, [name] 18 struggles..   ROS:  A  ROS was performed and pertinent positives and negatives are included in the history.  Exam:  Filed Vitals:   08/05/12 0952  BP: 132/88    General appearance:  Normal Head/Neck:  Normal, without cervical or supraclavicular adenopathy. Thyroid:  Symmetrical, normal in size, without palpable masses or nodularity. Respiratory  Effort:  Normal  Auscultation:  Clear without wheezing or rhonchi Cardiovascular  Auscultation:  Regular rate, without rubs, murmurs or gallops  Edema/varicosities:  Not grossly evident Abdominal  Soft,nontender, without masses, guarding or rebound.  Liver/spleen:  No organomegaly noted  Hernia:  None appreciated  Skin  Inspection:  Grossly normal  Palpation:  Grossly normal Neurologic/psychiatric  Orientation:  Normal with appropriate conversation.  Mood/affect:  Normal  Genitourinary    Breasts: Examined lying and sitting.     Right: Without masses, retractions, discharge or axillary adenopathy.     Left: 2-3 cm mobile nontender probable cyst at 10:00 position/similar to past ultrasounds   Inguinal/mons:  Normal without inguinal adenopathy  External genitalia:  Normal  BUS/Urethra/Skene's glands:  Normal  Bladder:  Normal  Vagina:  Normal  Cervix:  Normal  Uterus:   normal in size, shape  and contour.  Midline and mobile  Adnexa/parametria:     Rt: Without masses or tenderness.   Lt: Without masses or tenderness.  Anus and perineum: Normal  Digital rectal exam: Normal sphincter tone without palpated masses or tenderness  Assessment/Plan:  46 y.o. DW F. G5 P2  for annual exam with no complaints.  Fibrocystic breasts/mammogram overdue Perimenopausal Normal labs at work [facility] Depression stable on Wellbutrin 150  Plan: Amenorrheic x2 months with an elevated FSH. Instructed to call if further bleeding or problematic menopausal symptoms. Encourage condoms if sexually active. SBE's, report changes, reviewed importance of mammogram and encouraged 3D. Continue exercise, calcium rich diet, vitamin D 2000 daily encouraged. Reviewed importance of decreasing or quitting smoking. Wellbutrin 150 by mouth daily prescription, proper use given and reviewed states has done well and would like to continue. Denies need for counseling at this time. Uses than occasional Xanax 0.25, prescription, addictive properties reviewed. GC/Chlamydia culture only declines need for HIV, hepatitis or RPR. Condoms encouraged if become sexually active. No Pap new screening guidelines reviewed, normal Pap 2012.  Carmita Boom J WHNP, 1:10 PM 08/05/2012

## 2012-08-05 NOTE — Patient Instructions (Addendum)

## 2012-08-06 LAB — GC/CHLAMYDIA PROBE AMP, GENITAL
Chlamydia, DNA Probe: NEGATIVE
GC Probe Amp, Genital: NEGATIVE

## 2012-09-02 IMAGING — CR DG CHEST 2V
2 series · 2 of 2 positions shown · non-contrast
Comparison: Chest x-ray of 10/29/2010

CLINICAL DATA: Cough, chest tightness, shortness of breath

CHEST - 2 VIEW

[PA]
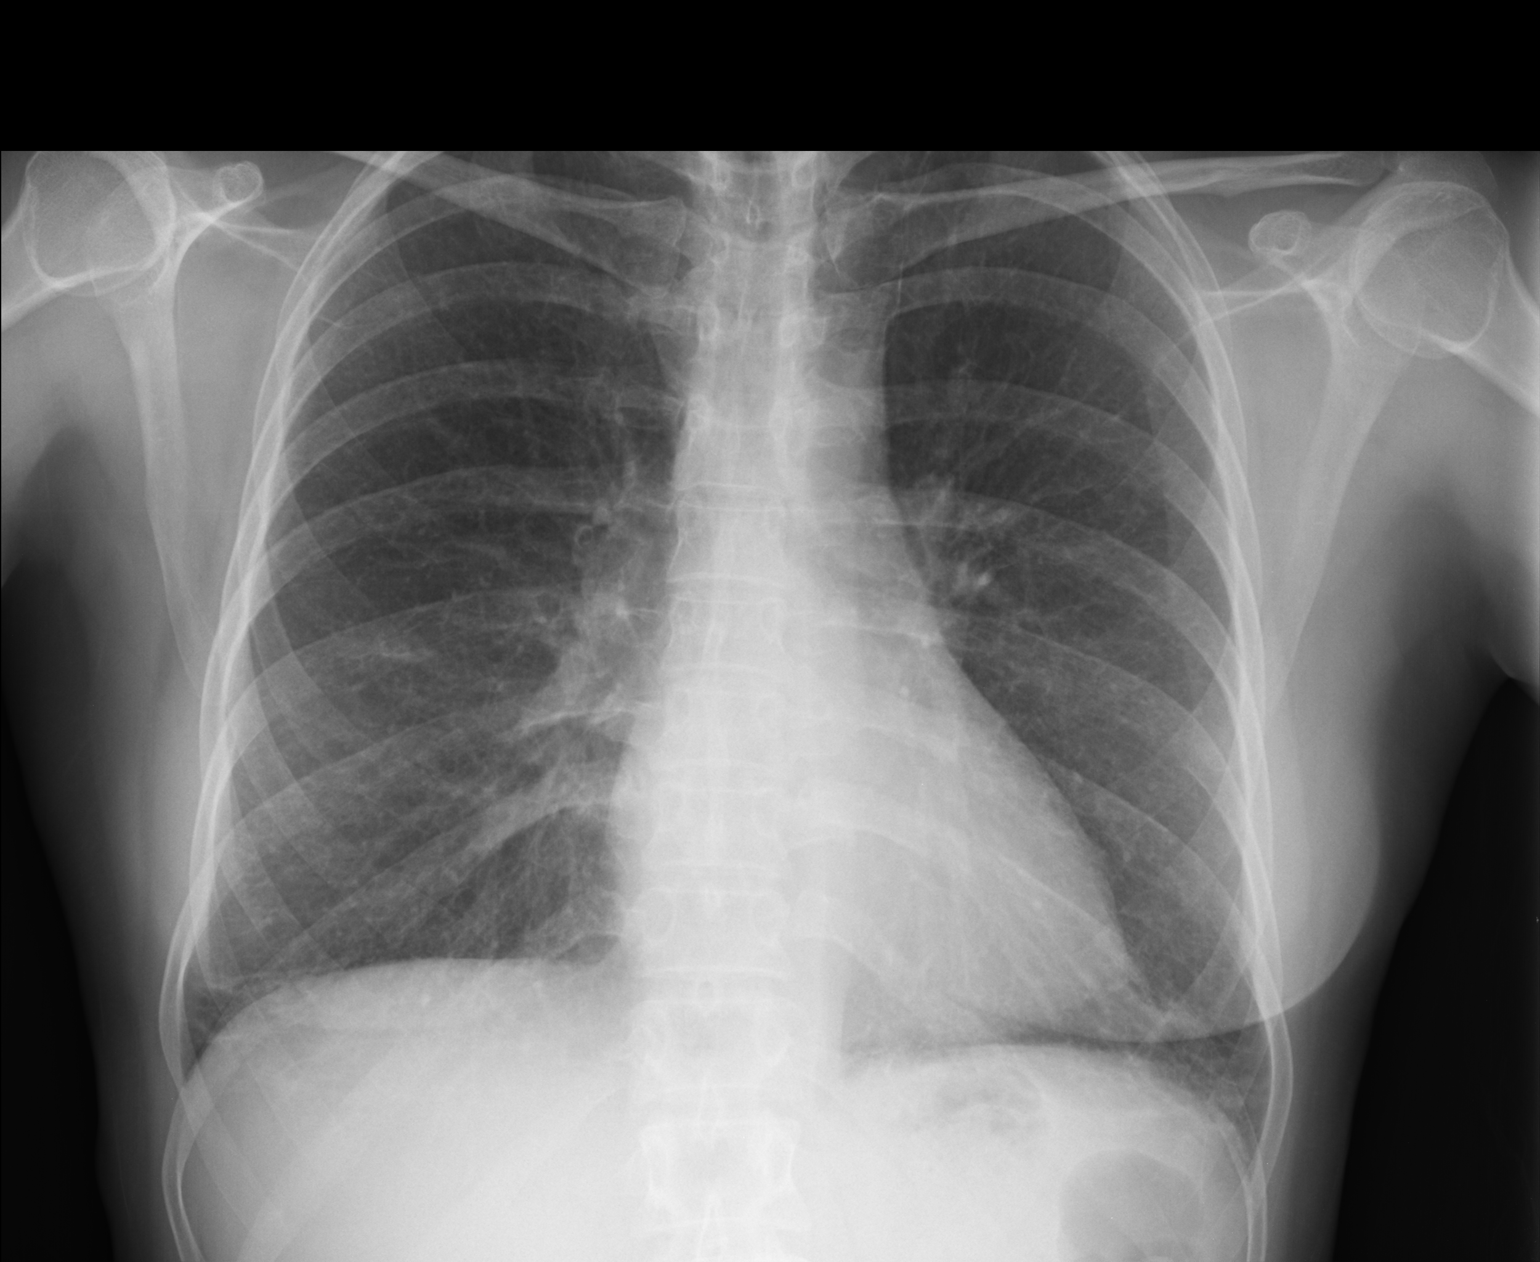

[lateral]
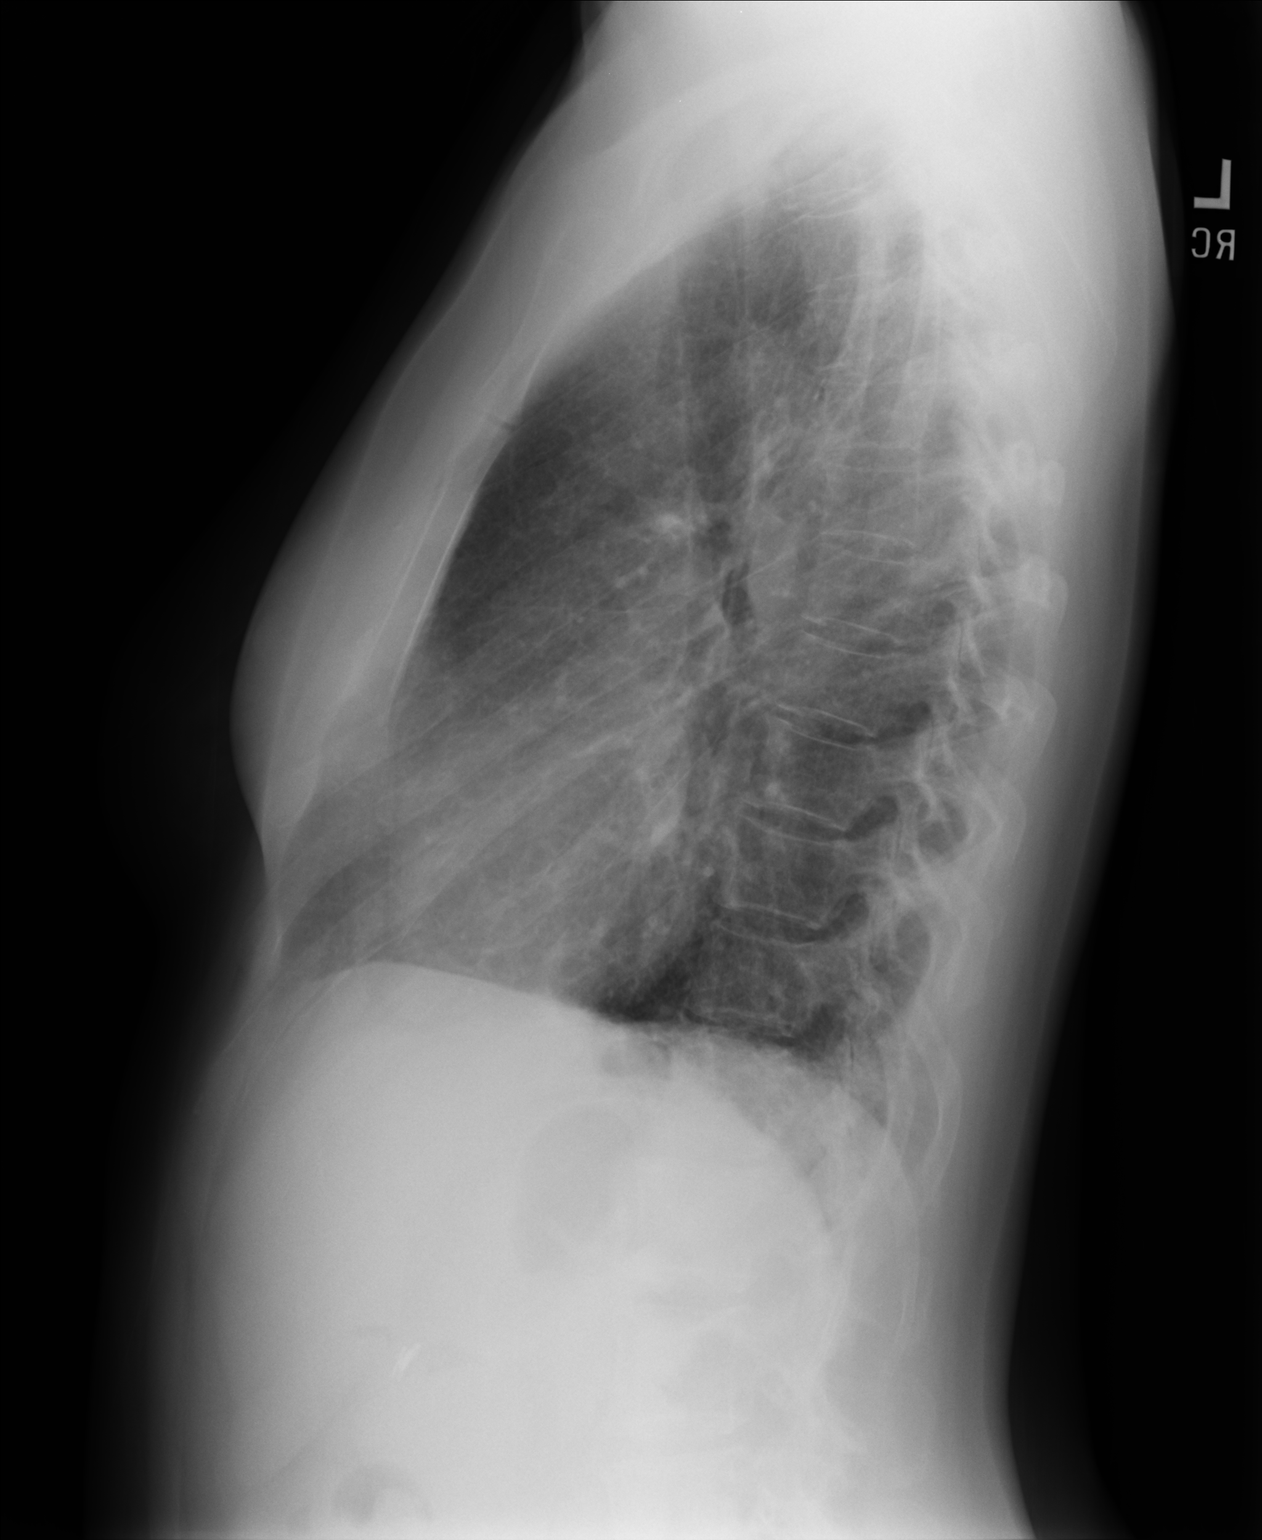

[2 of 2 positions shown; findings below may reference images not displayed]

FINDINGS: No definite pneumonia is seen.  There are more prominent
linear markings at both lung bases which may reflect bibasilar
atelectasis.  Follow-up is recommended to exclude developing
pneumonia.  No effusion is seen.  Mediastinal contours appear
stable.  The heart is within normal limits in size.  No bony
abnormality is seen.
IMPRESSION: No definite pneumonia.  However there are prominent linear markings
at both lung bases consistent with either a linear atelectasis or
early pneumonia.  Recommend follow-up chest x-ray.

## 2012-11-22 ENCOUNTER — Other Ambulatory Visit: Payer: Self-pay | Admitting: Women's Health

## 2012-12-14 ENCOUNTER — Other Ambulatory Visit: Payer: Self-pay | Admitting: Women's Health

## 2013-05-24 ENCOUNTER — Other Ambulatory Visit: Payer: Self-pay | Admitting: Women's Health

## 2013-05-24 NOTE — Telephone Encounter (Signed)
Called into pharmacy

## 2013-08-06 ENCOUNTER — Encounter: Payer: BC Managed Care – PPO | Admitting: Women's Health

## 2013-09-01 ENCOUNTER — Ambulatory Visit (INDEPENDENT_AMBULATORY_CARE_PROVIDER_SITE_OTHER): Payer: Self-pay | Admitting: Women's Health

## 2013-09-01 ENCOUNTER — Encounter: Payer: Self-pay | Admitting: Women's Health

## 2013-09-01 ENCOUNTER — Other Ambulatory Visit (HOSPITAL_COMMUNITY)
Admission: RE | Admit: 2013-09-01 | Discharge: 2013-09-01 | Disposition: A | Discharge status: Home / Self Care | Payer: BC Managed Care – PPO | Source: Ambulatory Visit | Attending: Women's Health | Admitting: Women's Health

## 2013-09-01 VITALS — BP 116/70 | Ht 65.5 in | Wt 154.0 lb

## 2013-09-01 DIAGNOSIS — Z113 Encounter for screening for infections with a predominantly sexual mode of transmission: Secondary | ICD-10-CM | POA: Insufficient documentation

## 2013-09-01 DIAGNOSIS — Z01419 Encounter for gynecological examination (general) (routine) without abnormal findings: Secondary | ICD-10-CM

## 2013-09-01 DIAGNOSIS — F329 Major depressive disorder, single episode, unspecified: Secondary | ICD-10-CM

## 2013-09-01 DIAGNOSIS — Z1151 Encounter for screening for human papillomavirus (HPV): Secondary | ICD-10-CM | POA: Insufficient documentation

## 2013-09-01 DIAGNOSIS — B9689 Other specified bacterial agents as the cause of diseases classified elsewhere: Secondary | ICD-10-CM

## 2013-09-01 DIAGNOSIS — N760 Acute vaginitis: Secondary | ICD-10-CM

## 2013-09-01 DIAGNOSIS — A499 Bacterial infection, unspecified: Secondary | ICD-10-CM

## 2013-09-01 MED ORDER — BUPROPION HCL ER (XL) 150 MG PO TB24: 150.0000 mg | ORAL_TABLET | Freq: Every morning | ORAL | Status: DC

## 2013-09-01 MED ORDER — METRONIDAZOLE 500 MG PO TABS: 500.0000 mg | ORAL_TABLET | Freq: Two times a day (BID) | ORAL | Status: DC

## 2013-09-01 NOTE — Patient Instructions (Signed)
Health Recommendations for Postmenopausal Women Respected and ongoing research has looked at the most common causes of death, disability, and poor quality of life in postmenopausal women. The causes include heart disease, diseases of blood vessels, diabetes, depression, cancer, and bone loss (osteoporosis). Many things can be done to help lower the chances of developing these and other common problems: CARDIOVASCULAR DISEASE Heart Disease: A heart attack is a medical emergency. Know the signs and symptoms of a heart attack. Below are things women can do to reduce their risk for heart disease.   Do not smoke. If you smoke, quit.  Aim for a healthy weight. Being overweight causes many preventable deaths. Eat a healthy and balanced diet and drink an adequate amount of liquids.  Get moving. Make a commitment to be more physically active. Aim for 30 minutes of activity on most, if not all days of the week.  Eat for heart health. Choose a diet that is low in saturated fat and cholesterol and eliminate trans fat. Include whole grains, vegetables, and fruits. Read and understand the labels on food containers before buying.  Know your numbers. Ask your caregiver to check your blood pressure, cholesterol (total, HDL, LDL, triglycerides) and blood glucose. Work with your caregiver on improving your entire clinical picture.  High blood pressure. Limit or stop your table salt intake (try salt substitute and food seasonings). Avoid salty foods and drinks. Read labels on food containers before buying. Eating well and exercising can help control high blood pressure. STROKE  Stroke is a medical emergency. Stroke may be the result of a blood clot in a blood vessel in the brain or by a brain hemorrhage (bleeding). Know the signs and symptoms of a stroke. To lower the risk of developing a stroke:  Avoid fatty foods.  Quit smoking.  Control your diabetes, blood pressure, and irregular heart rate. THROMBOPHLEBITIS  (BLOOD CLOT) OF THE LEG  Becoming overweight and leading a stationary lifestyle may also contribute to developing blood clots. Controlling your diet and exercising will help lower the risk of developing blood clots. CANCER SCREENING  Breast Cancer: Take steps to reduce your risk of breast cancer.  You should practice "breast self-awareness." This means understanding the normal appearance and feel of your breasts and should include breast self-examination. Any changes detected, no matter how small, should be reported to your caregiver.  After age 40, you should have a clinical breast exam (CBE) every year.  Starting at age 40, you should consider having a mammogram (breast X-ray) every year.  If you have a family history of breast cancer, talk to your caregiver about genetic screening.  If you are at high risk for breast cancer, talk to your caregiver about having an MRI and a mammogram every year.  Intestinal or Stomach Cancer: Tests to consider are a rectal exam, fecal occult blood, sigmoidoscopy, and colonoscopy. Women who are high risk may need to be screened at an earlier age and more often.  Cervical Cancer:  Beginning at age 30, you should have a Pap test every 3 years as long as the past 3 Pap tests have been normal.  If you have had past treatment for cervical cancer or a condition that could lead to cancer, you need Pap tests and screening for cancer for at least 20 years after your treatment.  If you had a hysterectomy for a problem that was not cancer or a condition that could lead to cancer, then you no longer need Pap tests.    If you are between ages 65 and 70, and you have had normal Pap tests going back 10 years, you no longer need Pap tests.  If Pap tests have been discontinued, risk factors (such as a new sexual partner) need to be reassessed to determine if screening should be resumed.  Some medical problems can increase the chance of getting cervical cancer. In these  cases, your caregiver may recommend more frequent screening and Pap tests.  Uterine Cancer: If you have vaginal bleeding after reaching menopause, you should notify your caregiver.  Ovarian cancer: Other than yearly pelvic exams, there are no reliable tests available to screen for ovarian cancer at this time except for yearly pelvic exams.  Lung Cancer: Yearly chest X-rays can detect lung cancer and should be done on high risk women, such as cigarette smokers and women with chronic lung disease (emphysema).  Skin Cancer: A complete body skin exam should be done at your yearly examination. Avoid overexposure to the sun and ultraviolet light lamps. Use a strong sun block cream when in the sun. All of these things are important in lowering the risk of skin cancer. MENOPAUSE Menopause Symptoms: Hormone therapy products are effective for treating symptoms associated with menopause:  Moderate to severe hot flashes.  Night sweats.  Mood swings.  Headaches.  Tiredness.  Loss of sex drive.  Insomnia.  Other symptoms. Hormone replacement carries certain risks, especially in older women. Women who use or are thinking about using estrogen or estrogen with progestin treatments should discuss that with their caregiver. Your caregiver will help you understand the benefits and risks. The ideal dose of hormone replacement therapy is not known. The Food and Drug Administration (FDA) has concluded that hormone therapy should be used only at the lowest doses and for the shortest amount of time to reach treatment goals.  OSTEOPOROSIS Protecting Against Bone Loss and Preventing Fracture: If you use hormone therapy for prevention of bone loss (osteoporosis), the risks for bone loss must outweigh the risk of the therapy. Ask your caregiver about other medications known to be safe and effective for preventing bone loss and fractures. To guard against bone loss or fractures, the following is recommended:  If  you are less than age 50, take 1000 mg of calcium and at least 600 mg of Vitamin D per day.  If you are greater than age 50 but less than age 70, take 1200 mg of calcium and at least 600 mg of Vitamin D per day.  If you are greater than age 70, take 1200 mg of calcium and at least 800 mg of Vitamin D per day. Smoking and excessive alcohol intake increases the risk of osteoporosis. Eat foods rich in calcium and vitamin D and do weight bearing exercises several times a week as your caregiver suggests. DIABETES Diabetes Melitus: If you have Type I or Type 2 diabetes, you should keep your blood sugar under control with diet, exercise and recommended medication. Avoid too many sweets, starchy and fatty foods. Being overweight can make control more difficult. COGNITION AND MEMORY Cognition and Memory: Menopausal hormone therapy is not recommended for the prevention of cognitive disorders such as Alzheimer's disease or memory loss.  DEPRESSION  Depression may occur at any age, but is common in elderly women. The reasons may be because of physical, medical, social (loneliness), or financial problems and needs. If you are experiencing depression because of medical problems and control of symptoms, talk to your caregiver about this. Physical activity and   exercise may help with mood and sleep. Community and volunteer involvement may help your sense of value and worth. If you have depression and you feel that the problem is getting worse or becoming severe, talk to your caregiver about treatment options that are best for you. ACCIDENTS  Accidents are common and can be serious in the elderly woman. Prepare your house to prevent accidents. Eliminate throw rugs, place hand bars in the bath, shower and toilet areas. Avoid wearing high heeled shoes or walking on wet, snowy, and icy areas. Limit or stop driving if you have vision or hearing problems, or you feel you are unsteady with you movements and  reflexes. HEPATITIS C Hepatitis C is a type of viral infection affecting the liver. It is spread mainly through contact with blood from an infected person. It can be treated, but if left untreated, it can lead to severe liver damage over years. Many people who are infected do not know that the virus is in their blood. If you are a "baby-boomer", it is recommended that you have one screening test for Hepatitis C. IMMUNIZATIONS  Several immunizations are important to consider having during your senior years, including:   Tetanus, diptheria, and pertussis booster shot.  Influenza every year before the flu season begins.  Pneumonia vaccine.  Shingles vaccine.  Others as indicated based on your specific needs. Talk to your caregiver about these. Document Released: 11/29/2005 Document Revised: 09/23/2012 Document Reviewed: 07/25/2008 [employer] Patient Information 2014 [employer].  

## 2013-09-01 NOTE — Progress Notes (Signed)
Hannah Proctor 1966-04-25 [id number]    History:    The patient presents for annual exam.  Amenorrheic x5 months, elevated FSH 2013. 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 Normal Pap since. 2013 numerous labs all normal TSH, lipid panel, comprehensive metabolic, hemoglobin A1c. New partner/condoms. Normal mammogram history.   Past medical history, past surgical history, family history and social history were all reviewed and documented in the EPIC chart. Currently without insurance, working without benefits. [name] 17, Pete 21 both doing okay. Had been on Wellbutrin for depression, unable to afford but would like to start back on. Smoking less than one half pack daily.   ROS:  A  ROS was performed and pertinent positives and negatives are included in the history.  Exam:  Filed Vitals:   09/01/13 0853  BP: 116/70    General appearance:  Normal Head/Neck:  Normal, without cervical or supraclavicular adenopathy. Thyroid:  Symmetrical, normal in size, without palpable masses or nodularity. Respiratory  Effort:  Normal  Auscultation:  Clear without wheezing or rhonchi Cardiovascular  Auscultation:  Regular rate, without rubs, murmurs or gallops  Edema/varicosities:  Not grossly evident Abdominal  Soft,nontender, without masses, guarding or rebound.  Liver/spleen:  No organomegaly noted  Hernia:  None appreciated  Skin  Inspection:  Grossly normal  Palpation:  Grossly normal Neurologic/psychiatric  Orientation:  Normal with appropriate conversation.  Mood/affect:  Normal  Genitourinary    Breasts: Examined lying and sitting.     Right: Without masses, retractions, discharge or axillary adenopathy.     Left: Without masses, retractions, discharge or axillary adenopathy.   Inguinal/mons:  Normal without inguinal adenopathy  External genitalia:  Normal  BUS/Urethra/Skene's glands:  Normal  Bladder:  Normal  Vagina:  Normal  Cervix:  Normal  Uterus:   normal in size, shape and contour.   Midline and mobile  Adnexa/parametria:     Rt: Without masses or tenderness.   Lt: Without masses or tenderness.  Anus and perineum: Normal  Digital rectal exam: Normal sphincter tone without palpated masses or tenderness  Assessment/Plan:  47 y.o. DWF G2P2 for annual exam.     Amenorrhea with elevated FSH with minimal menopausal complaints CIN-2 2002 normal after Depression stable on Wellbutrin Smoker less than half pack daily  Plan: SBE's, annual mammogram, calcium rich diet, vitamin D 2000 daily encouraged. Requested minimum/ no labs today, Pap. Pap normal 2012, new screening guidelines reviewed. Instructed to call if any further bleeding. Wellbutrin 150 twice daily prescription, proper use given and reviewed. Denies need for counseling at this time. Instructed partner to have STD screen. Aware of hazards of smoking will continue to decrease/quit.   Harrington Challenger [facility], 12:09 PM 09/01/2013

## 2013-09-24 ENCOUNTER — Encounter (HOSPITAL_COMMUNITY): Payer: Self-pay | Admitting: *Deleted

## 2013-09-24 ENCOUNTER — Telehealth: Payer: Self-pay | Admitting: Women's Health

## 2013-09-24 NOTE — Telephone Encounter (Signed)
Telephone call, Pap ASCUS negative HR HPV repeat Pap in one year.

## 2013-10-29 ENCOUNTER — Other Ambulatory Visit: Payer: Self-pay | Admitting: Women's Health

## 2013-10-29 DIAGNOSIS — Z1231 Encounter for screening mammogram for malignant neoplasm of breast: Secondary | ICD-10-CM

## 2013-11-09 ENCOUNTER — Other Ambulatory Visit: Payer: Self-pay | Admitting: Women's Health

## 2013-11-09 ENCOUNTER — Ambulatory Visit (HOSPITAL_COMMUNITY)
Admission: RE | Admit: 2013-11-09 | Discharge: 2013-11-09 | Disposition: A | Discharge status: Home / Self Care | Payer: BC Managed Care – PPO | Source: Ambulatory Visit | Attending: Women's Health | Admitting: Women's Health

## 2013-11-09 DIAGNOSIS — Z1231 Encounter for screening mammogram for malignant neoplasm of breast: Secondary | ICD-10-CM

## 2014-04-16 ENCOUNTER — Other Ambulatory Visit: Payer: Self-pay | Admitting: Women's Health

## 2014-04-18 NOTE — Telephone Encounter (Signed)
Ok to refill #[phone] with one refill

## 2014-04-18 NOTE — Telephone Encounter (Signed)
Called into pharmacy

## 2014-05-31 ENCOUNTER — Telehealth: Payer: Self-pay | Admitting: *Deleted

## 2014-05-31 DIAGNOSIS — F329 Major depressive disorder, single episode, unspecified: Secondary | ICD-10-CM

## 2014-05-31 DIAGNOSIS — F32A Depression, unspecified: Secondary | ICD-10-CM

## 2014-05-31 MED ORDER — BUPROPION HCL ER (XL) 150 MG PO TB24: 150.0000 mg | ORAL_TABLET | Freq: Every morning | ORAL | Status: DC

## 2014-05-31 NOTE — Telephone Encounter (Signed)
Pt called requesting Wellbutrin XL be switch to another pharmacy, [facility].  rx sent.

## 2014-08-22 ENCOUNTER — Encounter: Payer: Self-pay | Admitting: Women's Health

## 2014-11-14 ENCOUNTER — Other Ambulatory Visit: Payer: Self-pay

## 2014-11-14 DIAGNOSIS — F329 Major depressive disorder, single episode, unspecified: Secondary | ICD-10-CM

## 2014-11-14 DIAGNOSIS — F32A Depression, unspecified: Secondary | ICD-10-CM

## 2014-11-14 MED ORDER — BUPROPION HCL ER (XL) 150 MG PO TB24: 150.0000 mg | ORAL_TABLET | Freq: Every morning | ORAL | Status: DC

## 2015-01-16 ENCOUNTER — Other Ambulatory Visit: Payer: Self-pay

## 2015-01-16 DIAGNOSIS — F329 Major depressive disorder, single episode, unspecified: Secondary | ICD-10-CM

## 2015-01-16 DIAGNOSIS — F32A Depression, unspecified: Secondary | ICD-10-CM

## 2015-01-17 ENCOUNTER — Encounter: Payer: Self-pay | Admitting: Women's Health

## 2015-01-17 ENCOUNTER — Ambulatory Visit (INDEPENDENT_AMBULATORY_CARE_PROVIDER_SITE_OTHER): Payer: Self-pay | Admitting: Women's Health

## 2015-01-17 VITALS — BP 132/70 | Ht 65.0 in | Wt 153.0 lb

## 2015-01-17 DIAGNOSIS — F329 Major depressive disorder, single episode, unspecified: Secondary | ICD-10-CM

## 2015-01-17 DIAGNOSIS — Z01419 Encounter for gynecological examination (general) (routine) without abnormal findings: Secondary | ICD-10-CM

## 2015-01-17 DIAGNOSIS — Z13220 Encounter for screening for lipoid disorders: Secondary | ICD-10-CM

## 2015-01-17 DIAGNOSIS — Z833 Family history of diabetes mellitus: Secondary | ICD-10-CM

## 2015-01-17 DIAGNOSIS — F32A Depression, unspecified: Secondary | ICD-10-CM

## 2015-01-17 LAB — CBC WITH DIFFERENTIAL/PLATELET
BASOS ABS: 0.1 10*3/uL (ref 0.0–0.1)
BASOS PCT: 1 % (ref 0–1)
Eosinophils Absolute: 0.2 10*3/uL (ref 0.0–0.7)
Eosinophils Relative: 2 % (ref 0–5)
HCT: 39.5 % (ref 36.0–46.0)
Hemoglobin: 13.1 g/dL (ref 12.0–15.0)
LYMPHS PCT: 26 % (ref 12–46)
Lymphs Abs: 2.1 10*3/uL (ref 0.7–4.0)
MCH: 30.8 pg (ref 26.0–34.0)
MCHC: 33.2 g/dL (ref 30.0–36.0)
MCV: 92.7 fL (ref 78.0–100.0)
MONOS PCT: 7 % (ref 3–12)
MPV: 10 fL (ref 8.6–12.4)
Monocytes Absolute: 0.6 10*3/uL (ref 0.1–1.0)
Neutro Abs: 5.1 10*3/uL (ref 1.7–7.7)
Neutrophils Relative %: 64 % (ref 43–77)
PLATELETS: 301 10*3/uL (ref 150–400)
RBC: 4.26 MIL/uL (ref 3.87–5.11)
RDW: 13.6 % (ref 11.5–15.5)
WBC: 7.9 10*3/uL (ref 4.0–10.5)

## 2015-01-17 LAB — CHOLESTEROL, TOTAL: CHOLESTEROL: 159 mg/dL (ref 0–200)

## 2015-01-17 LAB — GLUCOSE, RANDOM: GLUCOSE: 79 mg/dL (ref 70–99)

## 2015-01-17 MED ORDER — ALPRAZOLAM 0.25 MG PO TABS: 0.2500 mg | ORAL_TABLET | Freq: Every evening | ORAL | Status: DC | PRN

## 2015-01-17 MED ORDER — BUPROPION HCL ER (XL) 150 MG PO TB24: 150.0000 mg | ORAL_TABLET | Freq: Every morning | ORAL | Status: DC

## 2015-01-17 NOTE — Patient Instructions (Signed)
Health Maintenance Adopting a healthy lifestyle and getting preventive care can go a long way to promote health and wellness. Talk with your health care provider about what schedule of regular examinations is right for you. This is a good chance for you to check in with your provider about disease prevention and staying healthy. In between checkups, there are plenty of things you can do on your own. Experts have done a lot of research about which lifestyle changes and preventive measures are most likely to keep you healthy. Ask your health care provider for more information. WEIGHT AND DIET  Eat a healthy diet  Be sure to include plenty of vegetables, fruits, low-fat dairy products, and lean protein.  Do not eat a lot of foods high in solid fats, added sugars, or salt.  Get regular exercise. This is one of the most important things you can do for your health.  Most adults should exercise for at least 150 minutes each week. The exercise should increase your heart rate and make you sweat (moderate-intensity exercise).  Most adults should also do strengthening exercises at least twice a week. This is in addition to the moderate-intensity exercise.  Maintain a healthy weight  Body mass index (BMI) is a measurement that can be used to identify possible weight problems. It estimates body fat based on height and weight. Your health care provider can help determine your BMI and help you achieve or maintain a healthy weight.  For females 25 years of age and older:   A BMI below 18.5 is considered underweight.  A BMI of 18.5 to 24.9 is normal.  A BMI of 25 to 29.9 is considered overweight.  A BMI of 30 and above is considered obese.  Watch levels of cholesterol and blood lipids  You should start having your blood tested for lipids and cholesterol at 51 years of age, then have this test every 5 years.  You may need to have your cholesterol levels checked more often if:  Your lipid or  cholesterol levels are high.  You are older than 51 years of age.  You are at high risk for heart disease.  CANCER SCREENING   Lung Cancer  Lung cancer screening is recommended for adults 97-92 years old who are at high risk for lung cancer because of a history of smoking.  A yearly low-dose CT scan of the lungs is recommended for people who:  Currently smoke.  Have quit within the past 15 years.  Have at least a 30-pack-year history of smoking. A pack year is smoking an average of one pack of cigarettes a day for 1 year.  Yearly screening should continue until it has been 15 years since you quit.  Yearly screening should stop if you develop a health problem that would prevent you from having lung cancer treatment.  Breast Cancer  Practice breast self-awareness. This means understanding how your breasts normally appear and feel.  It also means doing regular breast self-exams. Let your health care provider know about any changes, no matter how small.  If you are in your 20s or 30s, you should have a clinical breast exam (CBE) by a health care provider every 1-3 years as part of a regular health exam.  If you are 76 or older, have a CBE every year. Also consider having a breast X-ray (mammogram) every year.  If you have a family history of breast cancer, talk to your health care provider about genetic screening.  If you are  at high risk for breast cancer, talk to your health care provider about having an MRI and a mammogram every year.  Breast cancer gene (BRCA) assessment is recommended for women who have family members with BRCA-related cancers. BRCA-related cancers include:  Breast.  Ovarian.  Tubal.  Peritoneal cancers.  Results of the assessment will determine the need for genetic counseling and BRCA1 and BRCA2 testing. Cervical Cancer Routine pelvic examinations to screen for cervical cancer are no longer recommended for nonpregnant women who are considered low  risk for cancer of the pelvic organs (ovaries, uterus, and vagina) and who do not have symptoms. A pelvic examination may be necessary if you have symptoms including those associated with pelvic infections. Ask your health care provider if a screening pelvic exam is right for you.   The Pap test is the screening test for cervical cancer for women who are considered at risk.  If you had a hysterectomy for a problem that was not cancer or a condition that could lead to cancer, then you no longer need Pap tests.  If you are older than 65 years, and you have had normal Pap tests for the past 10 years, you no longer need to have Pap tests.  If you have had past treatment for cervical cancer or a condition that could lead to cancer, you need Pap tests and screening for cancer for at least 20 years after your treatment.  If you no longer get a Pap test, assess your risk factors if they change (such as having a new sexual partner). This can affect whether you should start being screened again.  Some women have medical problems that increase their chance of getting cervical cancer. If this is the case for you, your health care provider may recommend more frequent screening and Pap tests.  The human papillomavirus (HPV) test is another test that may be used for cervical cancer screening. The HPV test looks for the virus that can cause cell changes in the cervix. The cells collected during the Pap test can be tested for HPV.  The HPV test can be used to screen women 30 years of age and older. Getting tested for HPV can extend the interval between normal Pap tests from three to five years.  An HPV test also should be used to screen women of any age who have unclear Pap test results.  After 51 years of age, women should have HPV testing as often as Pap tests.  Colorectal Cancer  This type of cancer can be detected and often prevented.  Routine colorectal cancer screening usually begins at 50 years of  age and continues through 51 years of age.  Your health care provider may recommend screening at an earlier age if you have risk factors for colon cancer.  Your health care provider may also recommend using home test kits to check for hidden blood in the stool.  A small camera at the end of a tube can be used to examine your colon directly (sigmoidoscopy or colonoscopy). This is done to check for the earliest forms of colorectal cancer.  Routine screening usually begins at age 50.  Direct examination of the colon should be repeated every 5-10 years through 51 years of age. However, you may need to be screened more often if early forms of precancerous polyps or small growths are found. Skin Cancer  Check your skin from head to toe regularly.  Tell your health care provider about any new moles or changes in   moles, especially if there is a change in a mole's shape or color.  Also tell your health care provider if you have a mole that is larger than the size of a pencil eraser.  Always use sunscreen. Apply sunscreen liberally and repeatedly throughout the day.  Protect yourself by wearing long sleeves, pants, a wide-brimmed hat, and sunglasses whenever you are outside. HEART DISEASE, DIABETES, AND HIGH BLOOD PRESSURE   Have your blood pressure checked at least every 1-2 years. High blood pressure causes heart disease and increases the risk of stroke.  If you are between 75 years and 42 years old, ask your health care provider if you should take aspirin to prevent strokes.  Have regular diabetes screenings. This involves taking a blood sample to check your fasting blood sugar level.  If you are at a normal weight and have a low risk for diabetes, have this test once every three years after 51 years of age.  If you are overweight and have a high risk for diabetes, consider being tested at a younger age or more often. PREVENTING INFECTION  Hepatitis B  If you have a higher risk for  hepatitis B, you should be screened for this virus. You are considered at high risk for hepatitis B if:  You were born in a country where hepatitis B is common. Ask your health care provider which countries are considered high risk.  Your parents were born in a high-risk country, and you have not been immunized against hepatitis B (hepatitis B vaccine).  You have HIV or AIDS.  You use needles to inject street drugs.  You live with someone who has hepatitis B.  You have had sex with someone who has hepatitis B.  You get hemodialysis treatment.  You take certain medicines for conditions, including cancer, organ transplantation, and autoimmune conditions. Hepatitis C  Blood testing is recommended for:  Everyone born from [birth year] through 1967.  Anyone with known risk factors for hepatitis C. Sexually transmitted infections (STIs)  You should be screened for sexually transmitted infections (STIs) including gonorrhea and chlamydia if:  You are sexually active and are younger than 51 years of age.  You are older than 51 years of age and your health care provider tells you that you are at risk for this type of infection.  Your sexual activity has changed since you were last screened and you are at an increased risk for chlamydia or gonorrhea. Ask your health care provider if you are at risk.  If you do not have HIV, but are at risk, it may be recommended that you take a prescription medicine daily to prevent HIV infection. This is called pre-exposure prophylaxis (PrEP). You are considered at risk if:  You are sexually active and do not regularly use condoms or know the HIV status of your partner(s).  You take drugs by injection.  You are sexually active with a partner who has HIV. Talk with your health care provider about whether you are at high risk of being infected with HIV. If you choose to begin PrEP, you should first be tested for HIV. You should then be tested every 3 months for  as long as you are taking PrEP.  PREGNANCY   If you are premenopausal and you may become pregnant, ask your health care provider about preconception counseling.  If you may become pregnant, take 400 to 800 micrograms (mcg) of folic acid every day.  If you want to prevent pregnancy, talk to your  health care provider about birth control (contraception). OSTEOPOROSIS AND MENOPAUSE   Osteoporosis is a disease in which the bones lose minerals and strength with aging. This can result in serious bone fractures. Your risk for osteoporosis can be identified using a bone density scan.  If you are 65 years of age or older, or if you are at risk for osteoporosis and fractures, ask your health care provider if you should be screened.  Ask your health care provider whether you should take a calcium or vitamin D supplement to lower your risk for osteoporosis.  Menopause may have certain physical symptoms and risks.  Hormone replacement therapy may reduce some of these symptoms and risks. Talk to your health care provider about whether hormone replacement therapy is right for you.  HOME CARE INSTRUCTIONS   Schedule regular health, dental, and eye exams.  Stay current with your immunizations.   Do not use any tobacco products including cigarettes, chewing tobacco, or electronic cigarettes.  If you are pregnant, do not drink alcohol.  If you are breastfeeding, limit how much and how often you drink alcohol.  Limit alcohol intake to no more than 1 drink per day for nonpregnant women. One drink equals 12 ounces of beer, 5 ounces of wine, or 1 ounces of hard liquor.  Do not use street drugs.  Do not share needles.  Ask your health care provider for help if you need support or information about quitting drugs.  Tell your health care provider if you often feel depressed.  Tell your health care provider if you have ever been abused or do not feel safe at home. Document Released: 04/22/2011  Document Revised: 02/21/2014 Document Reviewed: 09/08/2013 ExitCare Patient Information 2015 [employer]. This information is not intended to replace advice given to you by your health care provider. Make sure you discuss any questions you have with your health care provider. Exercise to Stay Healthy Exercise helps you become and stay healthy. EXERCISE IDEAS AND TIPS Choose exercises that:  You enjoy.  Fit into your day. You do not need to exercise really hard to be healthy. You can do exercises at a slow or medium level and stay healthy. You can:  Stretch before and after working out.  Try yoga, Pilates, or tai chi.  Lift weights.  Walk fast, swim, jog, run, climb stairs, bicycle, dance, or rollerskate.  Take aerobic classes. Exercises that burn about 150 calories:  Running 1  miles in 15 minutes.  Playing volleyball for 45 to 60 minutes.  Washing and waxing a car for 45 to 60 minutes.  Playing touch football for 45 minutes.  Walking 1  miles in 35 minutes.  Pushing a stroller 1  miles in 30 minutes.  Playing basketball for 30 minutes.  Raking leaves for 30 minutes.  Bicycling 5 miles in 30 minutes.  Walking 2 miles in 30 minutes.  Dancing for 30 minutes.  Shoveling snow for 15 minutes.  Swimming laps for 20 minutes.  Walking up stairs for 15 minutes.  Bicycling 4 miles in 15 minutes.  Gardening for 30 to 45 minutes.  Jumping rope for 15 minutes.  Washing windows or floors for 45 to 60 minutes. Document Released: 11/09/2010 Document Revised: 12/30/2011 Document Reviewed: 11/09/2010 ExitCare Patient Information 2015 [employer]. This information is not intended to replace advice given to you by your health care provider. Make sure you discuss any questions you have with your health care provider.  

## 2015-01-17 NOTE — Progress Notes (Signed)
Hannah Proctor [birth year] [id number]    History:    Presents for annual exam.  Continues to have irregular cycles every 3-4 months, occasional hot flushes for the past year. Normal mammogram history, benign cyst. 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 2015 Pap ascus with negative HR HPV. Smokes less than half pack cigarettes daily. Negative colonoscopy 2003, currently without insurance and states cannot afford to repeat at this time. Adopted unknown history. Not sexually active.  Past medical history, past surgical history, family history and social history were all reviewed and documented in the EPIC chart. Works for an [employer], no benefits. Hannah BattenCody and [place]oulonPete both doing well working, 1 still in college.  ROS:  A ROS was performed and pertinent positives and negatives are included.  Exam:  Filed Vitals:   01/17/15 1554  BP: 132/70    General appearance:  Normal Thyroid:  Symmetrical, normal in size, without palpable masses or nodularity. Respiratory  Auscultation:  Clear without wheezing or rhonchi Cardiovascular  Auscultation:  Regular rate, without rubs, murmurs or gallops  Edema/varicosities:  Not grossly evident Abdominal  Soft,nontender, without masses, guarding or rebound.  Liver/spleen:  No organomegaly noted  Hernia:  None appreciated  Skin  Inspection:  Grossly normal   Breasts: Examined lying and sitting.     Right: Without masses, retractions, discharge or axillary adenopathy.     Left: Without masses, retractions, discharge or axillary adenopathy. Gentitourinary   Inguinal/mons:  Normal without inguinal adenopathy  External genitalia:  Normal  BUS/Urethra/Skene's glands:  Normal  Vagina:  Normal  Cervix:  Normal  Uterus: normal in size, shape and contour.  Midline and mobile  Adnexa/parametria:     Rt: Without masses or tenderness.   Lt: Without masses or tenderness.  Anus and perineum: Normal  Digital rectal exam: Normal sphincter tone without palpated masses  or tenderness  Assessment/Plan:  49 y.o. DWF G2P2 for annual exam.    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  Plan: Wishes to continue Wellbutrin, prescription, proper use given and reviewed, continue to decrease and stop smoking. Aware of hazards for health. Xanax 0.25 prescription, proper use, addictive properties reviewed, states uses one prescription annually aware of addictive properties. SBE's, annual screening mammogram, [facility] given instructed to schedule. Menopause reviewed, instructed to return to office if amenorrhea greater than 4 months. Condoms encouraged if sexually active. CBC, glucose, cholesterol, UA Pap negative HR HPV 2015, new screening guidelines reviewed.    Hannah ChallengerYOUNG,Hannah Proctor J Hegg Memorial Health CenterWHNP, 5:08 PM 01/17/2015

## 2015-01-18 LAB — URINALYSIS W MICROSCOPIC + REFLEX CULTURE
BILIRUBIN URINE: NEGATIVE
Bacteria, UA: NONE SEEN
Casts: NONE SEEN
Crystals: NONE SEEN
Glucose, UA: NEGATIVE mg/dL
Ketones, ur: NEGATIVE mg/dL
Leukocytes, UA: NEGATIVE
NITRITE: NEGATIVE
PROTEIN: NEGATIVE mg/dL
SQUAMOUS EPITHELIAL / LPF: NONE SEEN
Specific Gravity, Urine: 1.022 (ref 1.005–1.030)
UROBILINOGEN UA: 0.2 mg/dL (ref 0.0–1.0)
pH: 6 (ref 5.0–8.0)

## 2015-01-19 LAB — URINE CULTURE
Colony Count: NO GROWTH
Organism ID, Bacteria: NO GROWTH

## 2015-09-20 ENCOUNTER — Other Ambulatory Visit: Payer: Self-pay | Admitting: Women's Health

## 2015-09-20 DIAGNOSIS — Z1231 Encounter for screening mammogram for malignant neoplasm of breast: Secondary | ICD-10-CM

## 2015-10-27 ENCOUNTER — Ambulatory Visit
Admission: RE | Admit: 2015-10-27 | Discharge: 2015-10-27 | Disposition: A | Discharge status: Home / Self Care | Payer: No Typology Code available for payment source | Source: Ambulatory Visit | Attending: Women's Health | Admitting: Women's Health

## 2015-10-27 DIAGNOSIS — Z1231 Encounter for screening mammogram for malignant neoplasm of breast: Secondary | ICD-10-CM

## 2015-10-30 ENCOUNTER — Other Ambulatory Visit: Payer: Self-pay | Admitting: Women's Health

## 2015-10-30 DIAGNOSIS — R928 Other abnormal and inconclusive findings on diagnostic imaging of breast: Secondary | ICD-10-CM

## 2015-11-03 ENCOUNTER — Other Ambulatory Visit (HOSPITAL_COMMUNITY): Payer: Self-pay | Admitting: *Deleted

## 2015-11-03 DIAGNOSIS — R928 Other abnormal and inconclusive findings on diagnostic imaging of breast: Secondary | ICD-10-CM

## 2015-11-08 ENCOUNTER — Telehealth (HOSPITAL_COMMUNITY): Payer: Self-pay | Admitting: *Deleted

## 2015-11-08 NOTE — Telephone Encounter (Signed)
Telephoned patient at home # and confirmed appointment with [facility] on Thursday.

## 2015-11-09 ENCOUNTER — Other Ambulatory Visit (HOSPITAL_COMMUNITY): Payer: Self-pay | Admitting: Obstetrics and Gynecology

## 2015-11-09 ENCOUNTER — Encounter (HOSPITAL_COMMUNITY): Payer: Self-pay

## 2015-11-09 ENCOUNTER — Ambulatory Visit
Admission: RE | Admit: 2015-11-09 | Discharge: 2015-11-09 | Disposition: A | Discharge status: Home / Self Care | Payer: No Typology Code available for payment source | Source: Ambulatory Visit | Attending: Obstetrics and Gynecology | Admitting: Obstetrics and Gynecology

## 2015-11-09 ENCOUNTER — Encounter (HOSPITAL_COMMUNITY): Payer: Self-pay | Admitting: *Deleted

## 2015-11-09 ENCOUNTER — Ambulatory Visit (HOSPITAL_COMMUNITY)
Admission: RE | Admit: 2015-11-09 | Discharge: 2015-11-09 | Disposition: A | Discharge status: Home / Self Care | Payer: No Typology Code available for payment source | Source: Ambulatory Visit | Attending: Obstetrics and Gynecology | Admitting: Obstetrics and Gynecology

## 2015-11-09 VITALS — BP 110/64 | Temp 97.8°F | Ht 66.0 in | Wt 143.0 lb

## 2015-11-09 DIAGNOSIS — R928 Other abnormal and inconclusive findings on diagnostic imaging of breast: Secondary | ICD-10-CM

## 2015-11-09 DIAGNOSIS — Z1239 Encounter for other screening for malignant neoplasm of breast: Secondary | ICD-10-CM

## 2015-11-09 DIAGNOSIS — N6321 Unspecified lump in the left breast, upper outer quadrant: Secondary | ICD-10-CM

## 2015-11-09 NOTE — Patient Instructions (Addendum)
Educational materials on self breast awareness. Explained to Hannah Proctor that she did not need a Pap smear today due to last Pap smear was 09/01/2013. Let her know [facility] will cover Pap smears every 3 years unless has a history of abnormal Pap smears. Reminded patient that her next Pap smear is due in November 2017 and to call [facility] to schedule. Referred patient to the [facility] for a left breast diagnostic mammogram per recommendation. Appointment scheduled for Thursday, November 09, 2015 at 1400. Patient aware of appointment and will be there. Smoking cessation discussed with patient. Referred patient to the [facility] Quit Line and gave resources to free smoking cessation offered at the [facility]. Hannah Proctor verbalized understanding.  Sampson Self, Kathaleen Maser, RN 1:17 PM

## 2015-11-09 NOTE — Addendum Note (Signed)
Encounter addended by: Priscille Heidelberg, RN on: 11/09/2015  1:31 PM<BR>     Documentation filed: Patient Instructions Section

## 2015-11-09 NOTE — Progress Notes (Signed)
Patient referred to [facility] by the [facility] due to recommending additional imaging of left breast. Screening mammogram completed 10/27/2015 at the [facility] [place].  Pap Smear: Pap smear not completed today. Last Pap smear was 09/01/2013 at the free cervical cancer screening sponsored by the [facility] and [facility] HPV negative. 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 Patient stated Cryotherapy was completed following colposcopy. Last two Pap smear results are in EPIC.  Physical exam: Breasts Breasts symmetrical. No skin abnormalities bilateral breasts. No nipple retraction bilateral breasts. No nipple discharge bilateral breasts. No lymphadenopathy. No lumps palpated right breast. Palpated two moveable lumps within the left breast at 2 o'clock 2.5 cm from the nipple and at 1 o'clock 4 cm from the nipple. No complaints of pain or tenderness on exam. Referred patient to the [facility] for a left breast diagnostic mammogram per recommendation. Appointment scheduled for Thursday, November 09, 2015 at 1400.  Pelvic/Bimanual No Pap smear completed today since last Pap smear was 09/01/2013. Pap smear not indicated per [facility] guidelines.   Smoking History: Smoking cessation discussed with patient. Referred patient to the [facility] Quit Line and gave resources to free smoking cessation offered at the [facility].  Patient Navigation: Patient education provided. Access to services provided for patient through [facility] program.   Colorectal Cancer Screening: Patient had a colonoscopy completed 13-14 years ago. Explained to patient that a colonoscopy is recommended every 10 years beginning at age 28 and that she needs to schedule.

## 2015-11-15 ENCOUNTER — Ambulatory Visit
Admission: RE | Admit: 2015-11-15 | Discharge: 2015-11-15 | Disposition: A | Discharge status: Home / Self Care | Payer: No Typology Code available for payment source | Source: Ambulatory Visit | Attending: Women's Health | Admitting: Women's Health

## 2015-11-15 ENCOUNTER — Ambulatory Visit
Admission: RE | Admit: 2015-11-15 | Discharge: 2015-11-15 | Disposition: A | Discharge status: Home / Self Care | Payer: No Typology Code available for payment source | Source: Ambulatory Visit | Attending: Obstetrics and Gynecology | Admitting: Obstetrics and Gynecology

## 2015-11-15 DIAGNOSIS — R928 Other abnormal and inconclusive findings on diagnostic imaging of breast: Secondary | ICD-10-CM

## 2016-03-01 ENCOUNTER — Other Ambulatory Visit: Payer: Self-pay | Admitting: Women's Health

## 2016-04-10 ENCOUNTER — Other Ambulatory Visit: Payer: Self-pay | Admitting: Women's Health

## 2016-04-10 NOTE — Telephone Encounter (Signed)
Patient has CE scheduled in July. 

## 2016-04-29 ENCOUNTER — Telehealth (HOSPITAL_COMMUNITY): Payer: Self-pay | Admitting: *Deleted

## 2016-04-29 NOTE — Telephone Encounter (Signed)
Telephoned patient at home and advised was time for 6 month follow up at the Breast Center. Patient to call and schedule.

## 2016-05-10 ENCOUNTER — Ambulatory Visit (INDEPENDENT_AMBULATORY_CARE_PROVIDER_SITE_OTHER): Payer: BLUE CROSS/BLUE SHIELD | Admitting: Women's Health

## 2016-05-10 ENCOUNTER — Encounter: Payer: Self-pay | Admitting: Women's Health

## 2016-05-10 VITALS — BP 124/80 | Ht 65.0 in | Wt 134.0 lb

## 2016-05-10 DIAGNOSIS — F32A Depression, unspecified: Secondary | ICD-10-CM

## 2016-05-10 DIAGNOSIS — F329 Major depressive disorder, single episode, unspecified: Secondary | ICD-10-CM

## 2016-05-10 DIAGNOSIS — Z01419 Encounter for gynecological examination (general) (routine) without abnormal findings: Secondary | ICD-10-CM | POA: Diagnosis not present

## 2016-05-10 DIAGNOSIS — Z113 Encounter for screening for infections with a predominantly sexual mode of transmission: Secondary | ICD-10-CM | POA: Diagnosis not present

## 2016-05-10 DIAGNOSIS — Z13220 Encounter for screening for lipoid disorders: Secondary | ICD-10-CM | POA: Diagnosis not present

## 2016-05-10 LAB — CBC WITH DIFFERENTIAL/PLATELET
BASOS ABS: 79 {cells}/uL (ref 0–200)
Basophils Relative: 1 %
EOS ABS: 158 {cells}/uL (ref 15–500)
Eosinophils Relative: 2 %
HEMATOCRIT: 39.9 % (ref 35.0–45.0)
HEMOGLOBIN: 13 g/dL (ref 11.7–15.5)
LYMPHS ABS: 1817 {cells}/uL (ref 850–3900)
Lymphocytes Relative: 23 %
MCH: 30.1 pg (ref 27.0–33.0)
MCHC: 32.6 g/dL (ref 32.0–36.0)
MCV: 92.4 fL (ref 80.0–100.0)
MONO ABS: 474 {cells}/uL (ref 200–950)
MPV: 10.1 fL (ref 7.5–12.5)
Monocytes Relative: 6 %
NEUTROS ABS: 5372 {cells}/uL (ref 1500–7800)
NEUTROS PCT: 68 %
Platelets: 282 10*3/uL (ref 140–400)
RBC: 4.32 MIL/uL (ref 3.80–5.10)
RDW: 14.2 % (ref 11.0–15.0)
WBC: 7.9 10*3/uL (ref 3.8–10.8)

## 2016-05-10 MED ORDER — BUPROPION HCL ER (XL) 150 MG PO TB24: ORAL_TABLET | ORAL | Status: DC

## 2016-05-10 MED ORDER — ALPRAZOLAM 0.25 MG PO TABS: 0.2500 mg | ORAL_TABLET | Freq: Every evening | ORAL | Status: DC | PRN

## 2016-05-10 NOTE — Patient Instructions (Signed)

## 2016-05-10 NOTE — Progress Notes (Signed)
Hannah Proctor [birth year] [id number]    History:    Presents for annual exam.  Amenorrheic greater than one year, year prior had 2 cycles. 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 2003 negative colonoscopy. 10/2015 negative breast biopsy. New partner. Smokes less than one half pack per day. Depression stable on Wellbutrin uses an occasional Xanax for sleep. Had counseling in the past denies need at this time  Past medical history, past surgical history, family history and social history were all reviewed and documented in the EPIC chart. Works for an [employer], [employer] company. Sons  Ann[place]ody, 20 doing okay, Cindee Lameete 24 doing well. Adopted.  ROS:  A ROS was performed and pertinent positives and negatives are included.  Exam:  Filed Vitals:   05/10/16 0834  BP: 124/80    General appearance:  Normal Thyroid:  Symmetrical, normal in size, without palpable masses or nodularity. Respiratory  Auscultation:  Clear without wheezing or rhonchi Cardiovascular  Auscultation:  Regular rate, without rubs, murmurs or gallops  Edema/varicosities:  Not grossly evident Abdominal  Soft,nontender, without masses, guarding or rebound.  Liver/spleen:  No organomegaly noted  Hernia:  None appreciated  Skin  Inspection:  Grossly normal   Breasts: Examined lying and sitting.     Right: Without masses, retractions, discharge or axillary adenopathy.     Left: Without masses, retractions, discharge or axillary adenopathy. Gentitourinary   Inguinal/mons:  Normal without inguinal adenopathy  External genitalia:  Normal  BUS/Urethra/Skene's glands:  Normal  Vagina:  Normal  Cervix:  Normal  Uterus:   normal in size, shape and contour.  Midline and mobile  Adnexa/parametria:     Rt: Without masses or tenderness.   Lt: Without masses or tenderness.  Anus and perineum: Normal  Digital rectal exam: Normal sphincter tone without palpated masses or tenderness  Assessment/Plan:  50 y.o. D WF G2 P2 for annual exam with no  complaints.  Postmenopausal/no HRT/no bleeding Depression-stable on Wellbutrin Smoker less than one pack daily STD screen  Plan: SBE's, continue annual screening mammogram 3-D encouraged history of dense breasts. regular exercise, calcium rich diet, vitamin D 2000 daily encouraged. [facility] information given and reviewed instructed to schedule screening  Colonoscopy. Wellbutrin 150 mg daily prescription, proper use given and reviewed. Xanax 0.25 prescription, proper use given and reviewed addictive properties to use sparingly has used less than one prescription annually. Tips for smoking cessation reviewed aware of need to quit. CBC, CMP, lipid panel, vitamin D, TSH, UA, GC/Chlamydia, HIV, hep B, C, RPR. Pap with HR HPV typing, new screening guidelines reviewed.    Harrington ChallengerYOUNG,NANCY J WHNP, 1:02 PM 05/10/2016

## 2016-05-11 LAB — LIPID PANEL
CHOL/HDL RATIO: 1.6 ratio (ref <=5.0)
CHOLESTEROL: 147 mg/dL (ref 125–200)
HDL: 93 mg/dL (ref >=46)
LDL Cholesterol: 39 mg/dL (ref <130)
Triglycerides: 74 mg/dL (ref <150)
VLDL: 15 mg/dL (ref <30)

## 2016-05-11 LAB — URINALYSIS W MICROSCOPIC + REFLEX CULTURE
BACTERIA UA: NONE SEEN [HPF]
Bilirubin Urine: NEGATIVE
CASTS: NONE SEEN [LPF]
Crystals: NONE SEEN [HPF]
GLUCOSE, UA: NEGATIVE
Ketones, ur: NEGATIVE
Leukocytes, UA: NEGATIVE
Nitrite: NEGATIVE
PROTEIN: NEGATIVE
Specific Gravity, Urine: 1.016 (ref 1.001–1.035)
Squamous Epithelial / LPF: NONE SEEN [HPF] (ref <=5)
WBC, UA: NONE SEEN WBC/HPF (ref <=5)
YEAST: NONE SEEN [HPF]
pH: 7 (ref 5.0–8.0)

## 2016-05-11 LAB — HEPATITIS B SURFACE ANTIGEN: Hepatitis B Surface Ag: NEGATIVE

## 2016-05-11 LAB — VITAMIN D 25 HYDROXY (VIT D DEFICIENCY, FRACTURES): Vit D, 25-Hydroxy: 33 ng/mL (ref 30–100)

## 2016-05-11 LAB — COMPREHENSIVE METABOLIC PANEL
ALBUMIN: 4.3 g/dL (ref 3.6–5.1)
ALT: 9 U/L (ref 6–29)
AST: 16 U/L (ref 10–35)
Alkaline Phosphatase: 66 U/L (ref 33–130)
BUN: 11 mg/dL (ref 7–25)
CALCIUM: 9.1 mg/dL (ref 8.6–10.4)
CHLORIDE: 104 mmol/L (ref 98–110)
CO2: 23 mmol/L (ref 20–31)
CREATININE: 0.74 mg/dL (ref 0.50–1.05)
Glucose, Bld: 73 mg/dL (ref 65–99)
Potassium: 4.1 mmol/L (ref 3.5–5.3)
Sodium: 136 mmol/L (ref 135–146)
Total Bilirubin: 0.5 mg/dL (ref 0.2–1.2)
Total Protein: 6.7 g/dL (ref 6.1–8.1)

## 2016-05-11 LAB — RPR

## 2016-05-11 LAB — HEPATITIS C ANTIBODY: HCV AB: NEGATIVE

## 2016-05-11 LAB — HIV ANTIBODY (ROUTINE TESTING W REFLEX): HIV: NONREACTIVE

## 2016-05-11 LAB — GC/CHLAMYDIA PROBE AMP
CT Probe RNA: NOT DETECTED
GC PROBE AMP APTIMA: NOT DETECTED

## 2016-05-12 LAB — URINE CULTURE: ORGANISM ID, BACTERIA: NO GROWTH

## 2016-05-14 LAB — PAP, TP IMAGING W/ HPV RNA, RFLX HPV TYPE 16,18/45: HPV mRNA, High Risk: NOT DETECTED

## 2016-06-20 ENCOUNTER — Other Ambulatory Visit: Payer: Self-pay | Admitting: Women's Health

## 2016-06-20 DIAGNOSIS — N63 Unspecified lump in unspecified breast: Secondary | ICD-10-CM

## 2016-06-20 DIAGNOSIS — R921 Mammographic calcification found on diagnostic imaging of breast: Secondary | ICD-10-CM

## 2016-06-27 ENCOUNTER — Ambulatory Visit
Admission: RE | Admit: 2016-06-27 | Discharge: 2016-06-27 | Disposition: A | Discharge status: Home / Self Care | Payer: No Typology Code available for payment source | Source: Ambulatory Visit | Attending: Women's Health | Admitting: Women's Health

## 2016-06-27 DIAGNOSIS — R921 Mammographic calcification found on diagnostic imaging of breast: Secondary | ICD-10-CM

## 2016-07-30 IMAGING — MG MM BREAST BX W LOC DEV 1ST LESION IMAGE BX SPEC STEREO GUIDE*L*
1 series · 1 of 1 positions shown · non-contrast
Comparison: Previous exams.

ADDENDUM:
Pathology reveals FIBROCYSTIC CHANGES WITH CALCIFICATIONS of the
upper outer quadrant of the Left breast. This was found to be
concordant by Dr. Carina Lorena Siilva. Pathology results were discussed with
the patient via telephone. She reported no problems with the biopsy
site and is doing well. Post biopsy care and instructions were
reviewed and her questions were answered. She was encouraged to
contact The [REDACTED] with any additional
questions and or concerns. She was instructed to return for a
diagnostic Left mammogram in 6 months, per previous diagnostic
report, and was informed a reminder notice would be sent regarding
this appointment.

Pathology results reported by Guan Goldin RN on November 16, 2015.
CLINICAL DATA: 49-year-old female with indeterminate left breast
calcifications
EXAM:
LEFT BREAST STEREOTACTIC CORE NEEDLE BIOPSY

[L SPECIMEN]
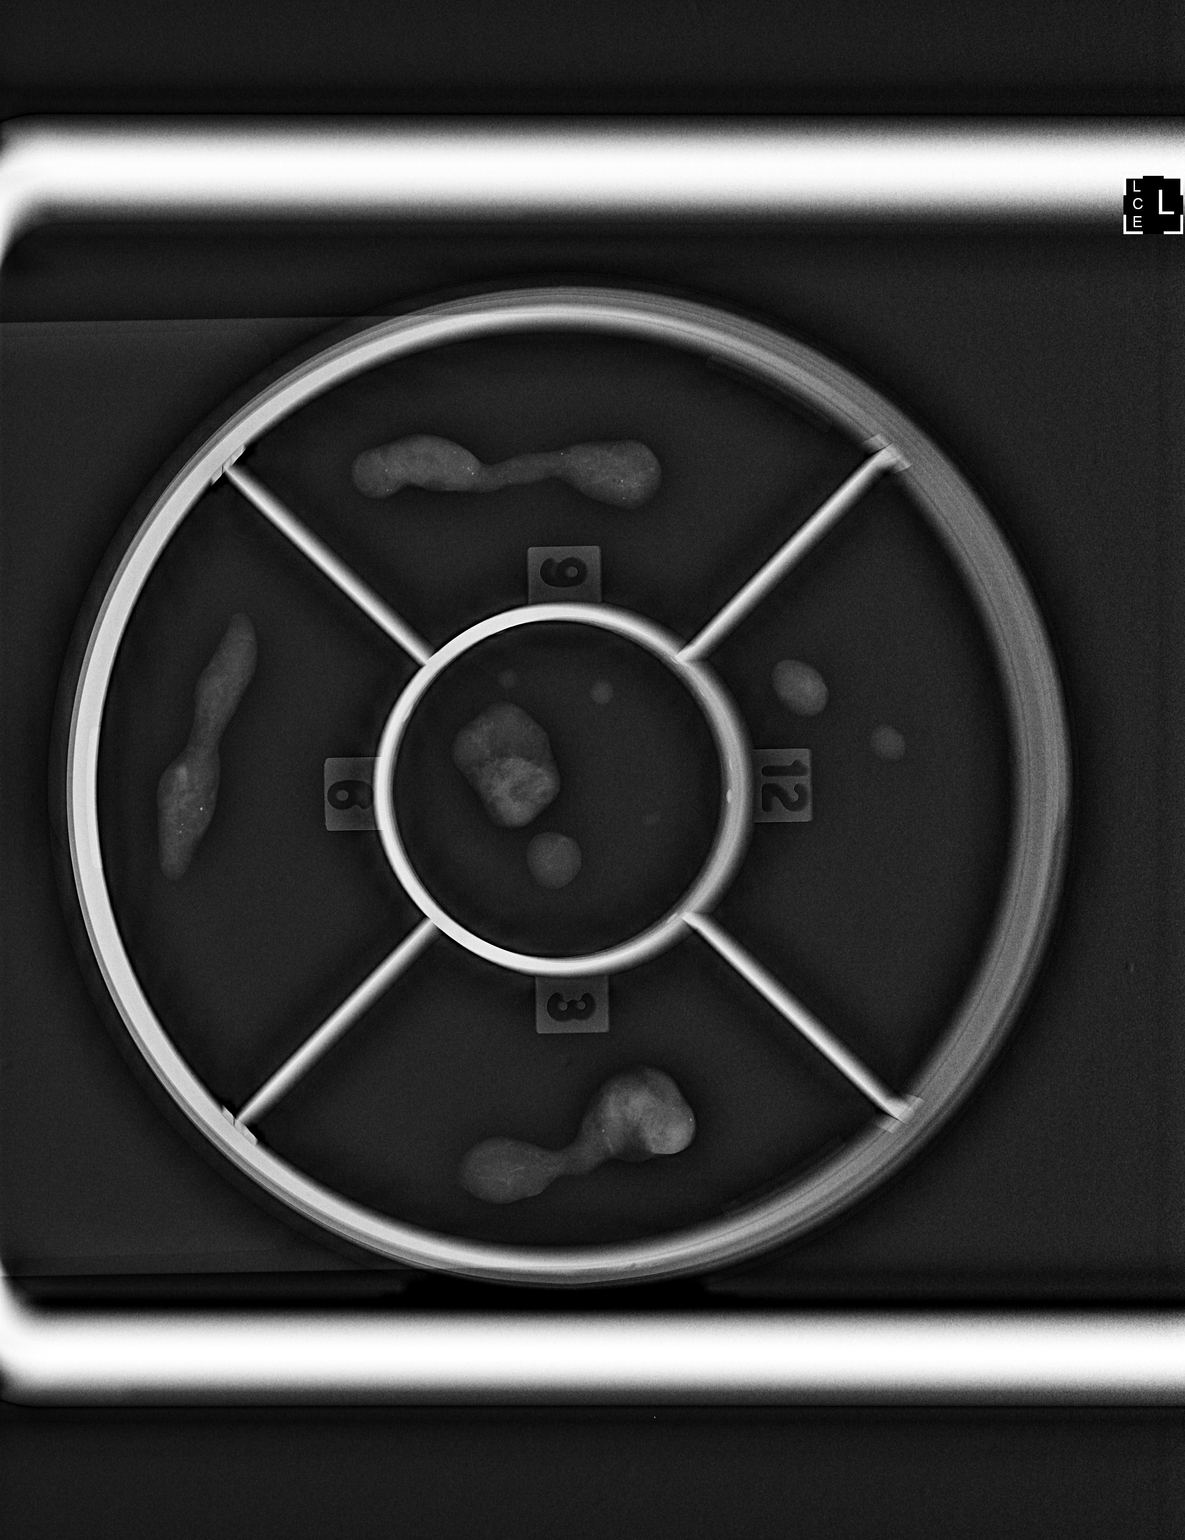

[1 of 1 positions shown; findings below may reference images not displayed]

FINDINGS: The patient and I discussed the procedure of stereotactic/
tomosynthesis -guided biopsy including benefits and alternatives. We
discussed the high likelihood of a successful procedure. We
discussed the risks of the procedure including infection, bleeding,
tissue injury, clip migration, and inadequate sampling. Informed
written consent was given. The usual time out protocol was performed
immediately prior to the procedure.

Using sterile technique and 1% Lidocaine as local anesthetic, under
stereotactic/ tomosynthesis guidance, a 9 gauge vacuum assist device
was used to perform core needle biopsy of calcifications in the
outer, slightly upper left breast using a lateral to medial
approach. Specimen radiograph was performed showing calcifications
within the specimen. Specimens with calcifications are identified
for pathology.

At the conclusion of the procedure, an X shaped tissue marker clip
was deployed into the biopsy cavity. Follow-up 2-view mammogram was
performed and dictated separately.
IMPRESSION: Stereotactic/tomosynthesis -guided biopsy of left breast
calcifications. No apparent complications.

## 2017-01-15 ENCOUNTER — Other Ambulatory Visit (HOSPITAL_COMMUNITY): Payer: Self-pay | Admitting: *Deleted

## 2017-01-15 DIAGNOSIS — R921 Mammographic calcification found on diagnostic imaging of breast: Secondary | ICD-10-CM

## 2017-01-16 ENCOUNTER — Ambulatory Visit
Admission: RE | Admit: 2017-01-16 | Discharge: 2017-01-16 | Disposition: A | Discharge status: Home / Self Care | Payer: No Typology Code available for payment source | Source: Ambulatory Visit | Attending: Obstetrics and Gynecology | Admitting: Obstetrics and Gynecology

## 2017-01-16 ENCOUNTER — Ambulatory Visit (HOSPITAL_COMMUNITY)
Admission: RE | Admit: 2017-01-16 | Discharge: 2017-01-16 | Disposition: A | Discharge status: Home / Self Care | Payer: Self-pay | Source: Ambulatory Visit | Attending: Obstetrics and Gynecology | Admitting: Obstetrics and Gynecology

## 2017-01-16 ENCOUNTER — Encounter (HOSPITAL_COMMUNITY): Payer: Self-pay

## 2017-01-16 VITALS — BP 124/72 | Temp 98.5°F | Ht 65.0 in | Wt 139.0 lb

## 2017-01-16 DIAGNOSIS — N6320 Unspecified lump in the left breast, unspecified quadrant: Secondary | ICD-10-CM

## 2017-01-16 DIAGNOSIS — Z1239 Encounter for other screening for malignant neoplasm of breast: Secondary | ICD-10-CM

## 2017-01-16 DIAGNOSIS — N6325 Unspecified lump in the left breast, overlapping quadrants: Secondary | ICD-10-CM

## 2017-01-16 DIAGNOSIS — R921 Mammographic calcification found on diagnostic imaging of breast: Secondary | ICD-10-CM

## 2017-01-16 NOTE — Patient Instructions (Signed)
Explained breast self awareness with Marisa HuaLeigh Aprea. Patient did not need a Pap smear today due to last Pap smear was 05/10/2016. Let her know that her next Pap smear is due in July 2018 due to her only having one normal Pap smear since her last abnormal. Let patient know she can call Sabrina to schedule appointment with [facility]. [facility] per recommendation. Appointment scheduled for Tuesday, January 21, 2017 at 1510. Marisa HuaLeigh Sorbo verbalized understanding.  [place], Kathaleen Maserhristine Poll, RN 12:31 PM

## 2017-01-16 NOTE — Progress Notes (Signed)
Complaints of bilateral breast lumps.  Pap Smear: Pap smear not completed today. Last Pap smear was 05/10/2016 and normal with negative HPV. Patient has a history of one abnormal Pap smear 09/13/2013 that was ASCUS with negative HPV and another abnormal Pap smear in 2002.  Physical exam: Breasts Breasts symmetrical. No skin abnormalities bilateral breasts. No nipple retraction bilateral breasts. No nipple discharge bilateral breasts. No lymphadenopathy. No lumps palpated right breast. Palpated two pea sized mobile lumps within the left breast at 12 o'clock 2 cm from the nipple and 3 o'clock 2 cm from the nipple. Complaints of tenderness when palpated both lumps. [facility] for diagnostic mamm[facility] per recommendation. Appointment scheduled for Tuesday, January 21, 2017 at 1510.      Pelvic/Bimanual No Pap smear completed today since last Pap smear was 05/10/2016. Pap smear not indicated per [facility] guidelines.   Smoking History: Patient is a current smoker. Discussed smoking cessation with patient. [facility] and gave resources to free smoking cessation classes at [facility].  Patient Navigation: Patient education provided. Access to services provided for [facility] through [facility].   Colorectal Cancer Screening: Per patient had a colonoscopy completed between 1999-2004. No complaints today. FIT Test given to patient to complete and return to [facility].

## 2017-01-17 ENCOUNTER — Encounter (HOSPITAL_COMMUNITY): Payer: Self-pay | Admitting: *Deleted

## 2017-01-21 ENCOUNTER — Ambulatory Visit
Admission: RE | Admit: 2017-01-21 | Discharge: 2017-01-21 | Disposition: A | Discharge status: Home / Self Care | Payer: No Typology Code available for payment source | Source: Ambulatory Visit | Attending: Obstetrics and Gynecology | Admitting: Obstetrics and Gynecology

## 2017-01-31 ENCOUNTER — Ambulatory Visit: Payer: Self-pay | Admitting: *Deleted

## 2017-01-31 ENCOUNTER — Other Ambulatory Visit (HOSPITAL_BASED_OUTPATIENT_CLINIC_OR_DEPARTMENT_OTHER): Payer: Self-pay

## 2017-01-31 VITALS — BP 122/80 | Ht 65.0 in | Wt 139.0 lb

## 2017-01-31 DIAGNOSIS — Z0000 Encounter for general adult medical examination without abnormal findings: Secondary | ICD-10-CM

## 2017-01-31 NOTE — Progress Notes (Signed)
Wisewoman Initial Screening  Patient referred to [facility] from [facility]  Clinical Measurement: HT:5'5  WT:139lb BP:122/80 BPx2:124/80 nonfasting labs drawn today, will review with patient when they result.   Medical History: Patient denies having high cholesterol, HTN or diabetes. She has never been diagnosed with heart disease.   Medication: pt is not taking any medications for HTN, high cholesterol or diabetes.   Blood Pressure, Self Measurement:pt does not measure her BP at home  Nutrition:She eats 1 cup of fruit a day, 4 cups of vegetables a day. She does not eat fish. Patient does not eat more than 3 ounces of whole grains a day. She drinks water and coffee only. She is not watching her sodium intake.   Physical Activity: pt gets about 120 min of moderate physical activity in a week and about 20 minutes of vigorous activity in a week  Smoking Status: pt is a current smoker, quit line and tobacco cessation given. She is not quite ready to quit.   Quality of Life: pt denies any mental/physical health issues preventing her from performing her normal daily activities.  Risk Reduction and Counseling:patient has set a goal to increase her fruit to 2 cups a day. She will also be starting a fitness program next week.   Navigation: will call pt with lab results within a week. She understands there will 2 more health coaching sessions via phone.

## 2017-02-03 LAB — LIPID PANEL
CHOLESTEROL TOTAL: 171 mg/dL (ref 100–199)
Chol/HDL Ratio: 1.9 ratio (ref 0.0–4.4)
HDL: 91 mg/dL (ref >39)
LDL CALC: 54 mg/dL (ref 0–99)
Triglycerides: 131 mg/dL (ref 0–149)
VLDL CHOLESTEROL CAL: 26 mg/dL (ref 5–40)

## 2017-02-03 LAB — HEMOGLOBIN A1C
ESTIMATED AVERAGE GLUCOSE: 88 mg/dL
Hemoglobin A1c: 4.7 % — ABNORMAL LOW (ref 4.8–5.6)

## 2017-02-05 ENCOUNTER — Ambulatory Visit: Payer: No Typology Code available for payment source

## 2017-02-05 ENCOUNTER — Telehealth: Payer: Self-pay
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
## 2017-02-07 ENCOUNTER — Ambulatory Visit: Payer: No Typology Code available for payment source

## 2017-02-10 ENCOUNTER — Telehealth: Payer: Self-pay

## 2017-02-10 NOTE — Telephone Encounter (Signed)
Health Coaching #3  Initial Screening: 01/31/2017  Goal: Patients goal was to increase her fruit intake. She states she has done this more this weekend. She will continue to work on this goal.  Navigation: Time spent with patient via phone: 10 Minutes, patient understands there will be a final follow up call in 4- 6 weeks.

## 2017-05-23 ENCOUNTER — Other Ambulatory Visit: Payer: Self-pay | Admitting: Women's Health

## 2017-05-23 DIAGNOSIS — F329 Major depressive disorder, single episode, unspecified: Secondary | ICD-10-CM

## 2017-05-23 DIAGNOSIS — F32A Depression, unspecified: Secondary | ICD-10-CM

## 2017-05-29 ENCOUNTER — Other Ambulatory Visit: Payer: Self-pay

## 2017-05-29 ENCOUNTER — Telehealth: Payer: Self-pay | Admitting: *Deleted

## 2017-05-29 NOTE — Telephone Encounter (Signed)
Pt called requesting refill on Wellbutrin XL 150 mg, [facility]. Okay to fill?

## 2017-06-01 NOTE — Telephone Encounter (Signed)
Message left, [facility]

## 2017-06-04 ENCOUNTER — Other Ambulatory Visit: Payer: Self-pay

## 2017-06-09 ENCOUNTER — Other Ambulatory Visit: Payer: Self-pay | Admitting: Women's Health

## 2017-06-09 MED ORDER — BUPROPION HCL ER (XL) 150 MG PO TB24: ORAL_TABLET | ORAL | 4 refills | Status: DC

## 2017-06-12 MED ORDER — BUPROPION HCL ER (XL) 150 MG PO TB24: 150.0000 mg | ORAL_TABLET | Freq: Every day | ORAL | 4 refills | Status: DC

## 2017-06-12 NOTE — Telephone Encounter (Signed)
It appears nancy spoke with patient on 06/09/17 because a Rx was sent to pharmacy approving RX Wellbutrin XL 150 mg #90 with 4 refills, but sent to wrong pharmacy Rx was placed under orders only I will send to correct pharmacy.

## 2017-07-02 ENCOUNTER — Telehealth (HOSPITAL_COMMUNITY): Payer: Self-pay

## 2017-07-02 NOTE — Telephone Encounter (Signed)
Follow up Wisewoman  Medical History: Patient denies having HTN, diabetes or high cholesterol.   Medication: Patient is not taking medication for high cholesterol, HTN or diabetes.  Blood Pressure, Self Measurement: Patient does not measure BP at home.   Nutrition: Patient eats 1 cup of fruit daily. She eats 2 cups of vegetables a day. She eats more than 3oz of grains daily. Patient drinks more than 36oz of beverages with sugar added weekly. She is watching her sodium intake.  Physical activity: Patient states that she is getting 1 hour a week in moderate activity and 30 minutes a week in vigorous activity.  Smoking Status: Patient is a current everyday smoker.   Quality of life: Patient states that she has stress daily at work that is temporary in nature. She denies any physical/mental health issues preventing her from preforming normal daily activities.  Risk Reduction and Counseling: Encouraged patient to increase fruit intake. Spoke with patient about quitting smoking.   Navigation: Patient stated tat she would like to participate in wisewoman again.

## 2017-08-11 ENCOUNTER — Encounter (HOSPITAL_COMMUNITY): Payer: Self-pay

## 2018-07-10 ENCOUNTER — Other Ambulatory Visit: Payer: Self-pay | Admitting: Women's Health

## 2019-09-03 ENCOUNTER — Telehealth: Payer: Self-pay | Admitting: *Deleted

## 2019-09-03 MED ORDER — BUPROPION HCL ER (XL) 150 MG PO TB24: ORAL_TABLET | ORAL | 0 refills | Status: DC

## 2019-09-03 NOTE — Telephone Encounter (Signed)
Patient informed. 

## 2019-09-03 NOTE — Telephone Encounter (Signed)
Patient called requesting Rx for Wellbutrin 150 mg tablet, last seen for annual exam on 04/2016. Now scheduled on 11/03/19 as a new patient. Patient just moved back to [place], was living in [place] taking care of her mother, which has now passed.  Please advise

## 2019-09-03 NOTE — Telephone Encounter (Signed)
[facility] for refill #90 no refill.  Pl give her our condolences   thanks

## 2019-11-02 ENCOUNTER — Other Ambulatory Visit: Payer: Self-pay

## 2019-11-03 ENCOUNTER — Ambulatory Visit (INDEPENDENT_AMBULATORY_CARE_PROVIDER_SITE_OTHER): Payer: 59 | Admitting: Women's Health

## 2019-11-03 ENCOUNTER — Encounter: Payer: Self-pay | Admitting: Women's Health

## 2019-11-03 VITALS — BP 118/78 | Ht 65.0 in | Wt 164.0 lb

## 2019-11-03 DIAGNOSIS — F17200 Nicotine dependence, unspecified, uncomplicated: Secondary | ICD-10-CM

## 2019-11-03 DIAGNOSIS — F329 Major depressive disorder, single episode, unspecified: Secondary | ICD-10-CM | POA: Diagnosis not present

## 2019-11-03 DIAGNOSIS — Z13220 Encounter for screening for lipoid disorders: Secondary | ICD-10-CM

## 2019-11-03 DIAGNOSIS — Z13820 Encounter for screening for osteoporosis: Secondary | ICD-10-CM

## 2019-11-03 DIAGNOSIS — F32A Depression, unspecified: Secondary | ICD-10-CM

## 2019-11-03 DIAGNOSIS — Z1151 Encounter for screening for human papillomavirus (HPV): Secondary | ICD-10-CM

## 2019-11-03 DIAGNOSIS — Z01419 Encounter for gynecological examination (general) (routine) without abnormal findings: Secondary | ICD-10-CM

## 2019-11-03 DIAGNOSIS — G4709 Other insomnia: Secondary | ICD-10-CM

## 2019-11-03 MED ORDER — BUPROPION HCL ER (XL) 150 MG PO TB24: 150.0000 mg | ORAL_TABLET | Freq: Every day | ORAL | 4 refills | Status: DC

## 2019-11-03 MED ORDER — ALPRAZOLAM 0.25 MG PO TABS: 0.2500 mg | ORAL_TABLET | Freq: Every evening | ORAL | 1 refills | Status: DC | PRN

## 2019-11-03 NOTE — Patient Instructions (Addendum)
It was good to see you today Vitamin D 2000 IUs daily Bone density Breast center [phone] Dr [address] or  Dr Leone Payor at [facility] [phone]  Health Maintenance for Postmenopausal Women Menopause is a normal process in which your ability to get pregnant comes to an end. This process happens slowly over many months or years, usually between the ages of 78 and 14. Menopause is complete when you have missed your menstrual periods for 12 months. It is important to talk with your health care provider about some of the most common conditions that affect women after menopause (postmenopausal women). These include heart disease, cancer, and bone loss (osteoporosis). Adopting a healthy lifestyle and getting preventive care can help to promote your health and wellness. The actions you take can also lower your chances of developing some of these common conditions. What should I know about menopause? During menopause, you may get a number of symptoms, such as:  Hot flashes. These can be moderate or severe.  Night sweats.  Decrease in sex drive.  Mood swings.  Headaches.  Tiredness.  Irritability.  Memory problems.  Insomnia. Choosing to treat or not to treat these symptoms is a decision that you make with your health care provider. Do I need hormone replacement therapy?  Hormone replacement therapy is effective in treating symptoms that are caused by menopause, such as hot flashes and night sweats.  Hormone replacement carries certain risks, especially as you become older. If you are thinking about using estrogen or estrogen with progestin, discuss the benefits and risks with your health care provider. What is my risk for heart disease and stroke? The risk of heart disease, heart attack, and stroke increases as you age. One of the causes may be a change in the body's hormones during menopause. This can affect how your body uses dietary fats, triglycerides, and cholesterol. Heart attack and stroke  are medical emergencies. There are many things that you can do to help prevent heart disease and stroke. Watch your blood pressure  High blood pressure causes heart disease and increases the risk of stroke. This is more likely to develop in people who have high blood pressure readings, are of African descent, or are overweight.  Have your blood pressure checked: ? Every 3-5 years if you are 34-85 years of age. ? Every year if you are 56 years old or older. Eat a healthy diet   Eat a diet that includes plenty of vegetables, fruits, low-fat dairy products, and lean protein.  Do not eat a lot of foods that are high in solid fats, added sugars, or sodium. Get regular exercise Get regular exercise. This is one of the most important things you can do for your health. Most adults should:  Try to exercise for at least 150 minutes each week. The exercise should increase your heart rate and make you sweat (moderate-intensity exercise).  Try to do strengthening exercises at least twice each week. Do these in addition to the moderate-intensity exercise.  Spend less time sitting. Even light physical activity can be beneficial. Other tips  Work with your health care provider to achieve or maintain a healthy weight.  Do not use any products that contain nicotine or tobacco, such as cigarettes, e-cigarettes, and chewing tobacco. If you need help quitting, ask your health care provider.  Know your numbers. Ask your health care provider to check your cholesterol and your blood sugar (glucose). Continue to have your blood tested as directed by your health care provider.  Do I need screening for cancer? Depending on your health history and family history, you may need to have cancer screening at different stages of your life. This may include screening for:  Breast cancer.  Cervical cancer.  Lung cancer.  Colorectal cancer. What is my risk for osteoporosis? After menopause, you may be at increased  risk for osteoporosis. Osteoporosis is a condition in which bone destruction happens more quickly than new bone creation. To help prevent osteoporosis or the bone fractures that can happen because of osteoporosis, you may take the following actions:  If you are 67-54 years old, get at least 1,000 mg of calcium and at least 600 mg of vitamin D per day.  If you are older than age 56 but younger than age 30, get at least 1,200 mg of calcium and at least 600 mg of vitamin D per day.  If you are older than age 3, get at least 1,200 mg of calcium and at least 800 mg of vitamin D per day. Smoking and drinking excessive alcohol increase the risk of osteoporosis. Eat foods that are rich in calcium and vitamin D, and do weight-bearing exercises several times each week as directed by your health care provider. How does menopause affect my mental health? Depression may occur at any age, but it is more common as you become older. Common symptoms of depression include:  Low or sad mood.  Changes in sleep patterns.  Changes in appetite or eating patterns.  Feeling an overall lack of motivation or enjoyment of activities that you previously enjoyed.  Frequent crying spells. Talk with your health care provider if you think that you are experiencing depression. General instructions See your health care provider for regular wellness exams and vaccines. This may include:  Scheduling regular health, dental, and eye exams.  Getting and maintaining your vaccines. These include: ? Influenza vaccine. Get this vaccine each year before the flu season begins. ? Pneumonia vaccine. ? Shingles vaccine. ? Tetanus, diphtheria, and pertussis (Tdap) booster vaccine. Your health care provider may also recommend other immunizations. Tell your health care provider if you have ever been abused or do not feel safe at home. Summary  Menopause is a normal process in which your ability to get pregnant comes to an  end.  This condition causes hot flashes, night sweats, decreased interest in sex, mood swings, headaches, or lack of sleep.  Treatment for this condition may include hormone replacement therapy.  Take actions to keep yourself healthy, including exercising regularly, eating a healthy diet, watching your weight, and checking your blood pressure and blood sugar levels.  Get screened for cancer and depression. Make sure that you are up to date with all your vaccines. This information is not intended to replace advice given to you by your health care provider. Make sure you discuss any questions you have with your health care provider. Document Revised: 09/30/2018 Document Reviewed: 09/30/2018 Elsevier Patient Education  2020 [employer].

## 2019-11-03 NOTE — Progress Notes (Addendum)
Hannah Proctor 11-27-1965 [id number]    History: 54 yo SWF G2P2 presents for annual exam. [place] of sick mother who died. Postmenopausal, no bleeding or HRT. Hot flashes, worse at night. Not sexually active, denies need for STD screen. Smoker, 1 pack/day, interested in quitting. CIN2 2002, normal paps since, last pap 2017. Negative breast biopsy in 2017, normal mammogram in 2018. Takes vitamin D 2000 iu daily. Medical problems include depression and anxiety, recent trouble sleeping. Takes occasional xanax for sleep.  Denies vaginal discharge, burning, itching, or urinary symptoms.   Past medical history, past surgical history, family history and social history were all reviewed and documented in the EPIC chart.  Works from home.  Recently moved back to [place] area. Mother deceased. Healthy grown sons who live in area.  ROS:  A ROS was performed and pertinent positives and negatives are included.  Exam:  Vitals:   11/03/19 1552  BP: 118/78  Weight: 164 lb (74.4 kg)  Height: 5\' 5"  (1.651 m)   Body mass index is 27.29 kg/m.   General appearance:  Normal Thyroid:  Symmetrical, normal in size, without palpable masses or nodularity. Respiratory  Auscultation:  Clear without wheezing or rhonchi Cardiovascular  Auscultation:  Regular rate, without rubs, murmurs or gallops  Edema/varicosities:  Not grossly evident Abdominal  Soft,nontender, without masses, guarding or rebound.  Liver/spleen:  No organomegaly noted  Hernia:  None appreciated  Skin  Inspection:  Grossly normal   Breasts: Examined lying and sitting.     Right: Without masses, retractions, discharge or axillary adenopathy.     Left: Without masses, retractions, discharge or axillary adenopathy. Gentitourinary   Inguinal/mons:  Normal without inguinal adenopathy  External genitalia:  Normal  BUS/Urethra/Skene's glands:  Normal  Vagina:  Normal  Cervix:   Normal  Uterus:  normal in size, shape and contour.  Midline and mobile  Adnexa/parametria:     Rt: Without masses or tenderness.   Lt: Without masses or tenderness.  Anus and perineum: Normal    Assessment/Plan:  54 y.o. SWF G2P2 for annual exam with complaint of poor sleep.   Postmenopausal with no bleeding and on no HRT  Smoking, 1 pack/day Depression stable on Wellbutrin    Plan: Wellbutrin 150 mg daily rx given, counseling encouraged as needed.reviewed may also help with smoking cessation.  Provided instructions to schedule mammogram, colonoscopy, and DEXA scan.  Aware of need for screenings.  Schedule appointment for fasting labs.  CBC, CMP, lipid panel.  Refill xanax 0.25 mg nightly as needed and her, cautioned about addictive potential and recommended not to take every night. Tips provided for quitting smoking and getting better nights sleep.  Continue with Vitamin D, regular exercise, calcium rich diet. Encouraged more self-care. Pap HR HPV, new screening guidelines reviewed.    [facility], 4:37 PM 11/03/2019

## 2019-11-04 LAB — PAP, TP IMAGING W/ HPV RNA, RFLX HPV TYPE 16,18/45: HPV DNA High Risk: NOT DETECTED

## 2019-11-25 ENCOUNTER — Other Ambulatory Visit: Payer: 59

## 2019-12-01 ENCOUNTER — Other Ambulatory Visit: Payer: Self-pay

## 2019-12-02 ENCOUNTER — Other Ambulatory Visit: Payer: 59

## 2019-12-02 ENCOUNTER — Ambulatory Visit (INDEPENDENT_AMBULATORY_CARE_PROVIDER_SITE_OTHER): Payer: 59

## 2019-12-02 DIAGNOSIS — M8589 Other specified disorders of bone density and structure, multiple sites: Secondary | ICD-10-CM | POA: Diagnosis not present

## 2019-12-02 DIAGNOSIS — Z13820 Encounter for screening for osteoporosis: Secondary | ICD-10-CM

## 2019-12-02 DIAGNOSIS — Z780 Asymptomatic menopausal state: Secondary | ICD-10-CM | POA: Diagnosis not present

## 2019-12-02 DIAGNOSIS — F17200 Nicotine dependence, unspecified, uncomplicated: Secondary | ICD-10-CM

## 2019-12-02 DIAGNOSIS — Z01419 Encounter for gynecological examination (general) (routine) without abnormal findings: Secondary | ICD-10-CM

## 2019-12-02 DIAGNOSIS — Z13220 Encounter for screening for lipoid disorders: Secondary | ICD-10-CM

## 2019-12-02 LAB — COMPREHENSIVE METABOLIC PANEL
AG Ratio: 1.9 (calc) (ref 1.0–2.5)
ALT: 11 U/L (ref 6–29)
AST: 15 U/L (ref 10–35)
Albumin: 4.4 g/dL (ref 3.6–5.1)
Alkaline phosphatase (APISO): 75 U/L (ref 37–153)
BUN: 13 mg/dL (ref 7–25)
CO2: 25 mmol/L (ref 20–32)
Calcium: 9.6 mg/dL (ref 8.6–10.4)
Chloride: 104 mmol/L (ref 98–110)
Creat: 0.81 mg/dL (ref 0.50–1.05)
Globulin: 2.3 g/dL (calc) (ref 1.9–3.7)
Glucose, Bld: 82 mg/dL (ref 65–99)
Potassium: 4.1 mmol/L (ref 3.5–5.3)
Sodium: 139 mmol/L (ref 135–146)
Total Bilirubin: 0.5 mg/dL (ref 0.2–1.2)
Total Protein: 6.7 g/dL (ref 6.1–8.1)

## 2019-12-02 LAB — CBC WITH DIFFERENTIAL/PLATELET
Absolute Monocytes: 488 cells/uL (ref 200–950)
Basophils Absolute: 67 cells/uL (ref 0–200)
Basophils Relative: 0.9 %
Eosinophils Absolute: 133 cells/uL (ref 15–500)
Eosinophils Relative: 1.8 %
HCT: 39.8 % (ref 35.0–45.0)
Hemoglobin: 14 g/dL (ref 11.7–15.5)
Lymphs Abs: 2368 cells/uL (ref 850–3900)
MCH: 32 pg (ref 27.0–33.0)
MCHC: 35.2 g/dL (ref 32.0–36.0)
MCV: 91.1 fL (ref 80.0–100.0)
MPV: 10.7 fL (ref 7.5–12.5)
Monocytes Relative: 6.6 %
Neutro Abs: 4344 cells/uL (ref 1500–7800)
Neutrophils Relative %: 58.7 %
Platelets: 263 10*3/uL (ref 140–400)
RBC: 4.37 10*6/uL (ref 3.80–5.10)
RDW: 12.6 % (ref 11.0–15.0)
Total Lymphocyte: 32 %
WBC: 7.4 10*3/uL (ref 3.8–10.8)

## 2019-12-02 LAB — LIPID PANEL
Cholesterol: 158 mg/dL (ref <200)
HDL: 79 mg/dL (ref >=50)
LDL Cholesterol (Calc): 64 mg/dL (calc)
Non-HDL Cholesterol (Calc): 79 mg/dL (calc) (ref <130)
Total CHOL/HDL Ratio: 2 (calc) (ref <5.0)
Triglycerides: 74 mg/dL (ref <150)

## 2019-12-03 ENCOUNTER — Other Ambulatory Visit: Payer: Self-pay | Admitting: Obstetrics & Gynecology

## 2019-12-03 DIAGNOSIS — M8589 Other specified disorders of bone density and structure, multiple sites: Secondary | ICD-10-CM

## 2019-12-03 DIAGNOSIS — Z780 Asymptomatic menopausal state: Secondary | ICD-10-CM

## 2020-01-03 ENCOUNTER — Other Ambulatory Visit: Payer: Self-pay | Admitting: Women's Health

## 2020-01-03 DIAGNOSIS — F329 Major depressive disorder, single episode, unspecified: Secondary | ICD-10-CM

## 2020-01-03 DIAGNOSIS — F32A Depression, unspecified: Secondary | ICD-10-CM

## 2020-01-03 DIAGNOSIS — F17200 Nicotine dependence, unspecified, uncomplicated: Secondary | ICD-10-CM

## 2021-07-30 ENCOUNTER — Other Ambulatory Visit: Payer: Self-pay

## 2021-07-30 ENCOUNTER — Ambulatory Visit
Admission: RE | Admit: 2021-07-30 | Discharge: 2021-07-30 | Disposition: A | Discharge status: Home / Self Care | Payer: 59 | Source: Ambulatory Visit | Attending: Emergency Medicine | Admitting: Emergency Medicine

## 2021-07-30 VITALS — BP 139/96 | HR 94 | Temp 98.0°F | Resp 18

## 2021-07-30 DIAGNOSIS — R1032 Left lower quadrant pain: Secondary | ICD-10-CM

## 2021-07-30 MED ORDER — METRONIDAZOLE 500 MG PO TABS: 500.0000 mg | ORAL_TABLET | Freq: Three times a day (TID) | ORAL | 0 refills | Status: AC

## 2021-07-30 MED ORDER — CIPROFLOXACIN HCL 500 MG PO TABS: 500.0000 mg | ORAL_TABLET | Freq: Two times a day (BID) | ORAL | 0 refills | Status: AC

## 2021-07-30 NOTE — Discharge Instructions (Addendum)
After discussing your concerns with my supervising physician, he has recommended that you begin taking 2 antibiotics, ciprofloxacin and metronidazole, to treat you for presumed intra abdominal infection such as diverticulitis.  We will also check a CBC today to evaluate for an elevated white blood cell count.  If your white blood cell count is elevated, you will need to go as soon as possible to the emergency room to have a CT scan done of your abdomen.  If you do not have improvement of your symptoms once you complete 7 days of these 2 antibiotics, you will need to go as soon as possible to the emergency room to have a CT scan done of your abdomen.  If you have worsening symptoms while you are taking these 2 antibiotics, you will need to go as soon as possible to the emergency room to have a CT scan done of your abdomen.  If your symptoms improve with these 2 antibiotics, it is recommended that you follow-up with either primary care or gastroenterologist to discuss the episode and pursue further testing, if needed.  Thank you for visiting urgent care today, I do hope you feel better with this treatment.

## 2021-07-30 NOTE — ED Triage Notes (Signed)
Patient presents to [facility] for evaluation of LLQ pain starting 9 days ago.  States she has had a lot of mucous in her stool which has been waxing and waning.  Patient states she has had a fever of 101 coming and going.  States hx of gastritis, no hx of bowel surgery or diverticulitis.

## 2021-07-30 NOTE — ED Provider Notes (Signed)
[facility]    CSN: [id number] Arrival date & time: 07/30/21  1105      History   Chief Complaint Chief Complaint  Patient presents with   Abdominal Pain   Appointment: 1130am    HPI Hannah Proctor is a 55 y.o. female.   Patient presents to [facility] for evaluation of LLQ pain starting 9 days ago.  States she has had a lot of mucous in her stool which has been waxing and waning, states the pain waxes and wanes as well.  Patient states she has had a fever of 101 coming and going.  Patient reports a history of gastritis however denies a history of IBS or diverticulosis/diverticulitis.  Patient states last fever was approximately 3 days ago.  Patient states the pain is always in her left lower quadrant, is stabbing in sensation, occurs acutely, last several hours, is constant when present, then resolves on its own without apparent intervention she is aware of.  Patient states has not tried medications for this, states mostly because she is not sure what the problem is or how to treat it.  States initially she hoped it would just go away on its own but now that its been 9 days she like to have it evaluated.  The history is provided by the patient.   Past Medical History:  Diagnosis Date   Breast cyst 2001   CIN I (cervical intraepithelial neoplasia I)    Depression    Reflux    Smoker     Patient Active Problem List   Diagnosis Date Noted   Depression 08/05/2012   Smokes < 1 pack of cigarettes per day 08/05/2012    Past Surgical History:  Procedure Laterality Date   BREAST SURGERY     Biopsy benign   CESAREAN SECTION  1997   GANGLION CYST EXCISION  1996   LAPAROSCOPIC CHOLECYSTECTOMY  1991    OB History     Gravida  5   Para  2   Term  2   Preterm      AB  3   Living  2      SAB  3   IAB      Ectopic      Multiple      Live Births               Home Medications    Prior to Admission medications   Medication Sig Start Date End Date  Taking? Authorizing Provider  ciprofloxacin (CIPRO) 500 MG tablet Take 1 tablet (500 mg total) by mouth 2 (two) times daily for 10 days. 07/30/21 08/09/21 Yes Theadora Rama Scales, PA-C  metroNIDAZOLE (FLAGYL) 500 MG tablet Take 1 tablet (500 mg total) by mouth 3 (three) times daily for 10 days. 07/30/21 08/09/21 Yes Theadora Rama Scales, PA-C  albuterol-ipratropium (COMBIVENT) 18-103 MCG/ACT inhaler Inhale 2 puffs into the lungs every 6 (six) hours as needed for wheezing or shortness of breath. 12/19/11 01/17/15  Le, Thao P, DO  ALPRAZolam (XANAX) 0.25 MG tablet Take 1 tablet (0.25 mg total) by mouth at bedtime as needed. for sleep 11/03/19   Harrington Challenger, NP  CALCIUM PO Take 1 tablet by mouth daily. Reported on 11/09/2015    [provider]  Multiple Vitamin (MULTIVITAMIN) tablet Take 1 tablet by mouth daily.    [provider]  Probiotic Product (PRO-BIOTIC BLEND) CAPS Take by mouth.      [provider]  VITAMIN D PO Take by  mouth.    [provider]    Family History Family History  Adopted: Yes    Social History Social History   Tobacco Use   Smoking status: Every Day    Packs/day: 1.00    Years: 30.00    Pack years: 30.00    Types: Cigarettes   Smokeless tobacco: Never  Vaping Use   Vaping Use: Former  Substance Use Topics   Alcohol use: Yes    Alcohol/week: 0.0 standard drinks    Comment: ocassioanlly   Drug use: No     Allergies   Codeine and Penicillins   Review of Systems Review of Systems   Physical Exam Triage Vital Signs ED Triage Vitals  Enc Vitals Group     BP 07/30/21 1245 (!) 139/96     Pulse Rate 07/30/21 1245 94     Resp 07/30/21 1245 18     Temp 07/30/21 1245 98 F (36.7 C)     Temp Source 07/30/21 1245 Oral     SpO2 07/30/21 1245 98 %     Weight --      Height --      Head Circumference --      Peak Flow --      Pain Score 07/30/21 1244 4     Pain Loc --      Pain Edu? --      Excl. in GC? --     No data found.  Updated Vital Signs BP (!) 139/96 (BP Location: Right Arm)   Pulse 94   Temp 98 F (36.7 C) (Oral)   Resp 18   LMP 01/14/2015 Comment: SPOTTING  SpO2 98%   Visual Acuity Right Eye Distance:   Left Eye Distance:   Bilateral Distance:    Right Eye Near:   Left Eye Near:    Bilateral Near:     Physical Exam Vitals and nursing note reviewed.  Constitutional:      Appearance: Normal appearance.  HENT:     Head: Normocephalic and atraumatic.  Eyes:     Conjunctiva/sclera: Conjunctivae normal.  Cardiovascular:     Rate and Rhythm: Normal rate and regular rhythm.     Pulses: Normal pulses.     Heart sounds: Normal heart sounds.  Pulmonary:     Effort: Pulmonary effort is normal.     Breath sounds: Normal breath sounds.  Abdominal:     General: Abdomen is flat. Bowel sounds are normal.     Palpations: Abdomen is soft.     Tenderness: There is abdominal tenderness in the left lower quadrant. There is no right CVA tenderness, left CVA tenderness, guarding or rebound. Negative signs include Murphy's sign, Rovsing's sign, McBurney's sign, psoas sign and obturator sign.  Musculoskeletal:        General: Normal range of motion.     Cervical back: Normal range of motion and neck supple.  Lymphadenopathy:     Cervical: No cervical adenopathy.     Lower Body: No right inguinal adenopathy. No left inguinal adenopathy.  Skin:    General: Skin is warm and dry.  Neurological:     General: No focal deficit present.     Mental Status: She is alert and oriented to person, place, and time. Mental status is at baseline.  Psychiatric:        Mood and Affect: Mood normal.        Behavior: Behavior normal.     UC Treatments / Results  Labs (all  labs ordered are listed, but only abnormal results are displayed) Labs Reviewed  CBC WITH DIFFERENTIAL/PLATELET    EKG   Radiology No results found.  Procedures Procedures (including critical care  time)  Medications Ordered in UC Medications - No data to display  Initial Impression / Assessment and Plan / UC Course  I have reviewed the triage vital signs and the nursing notes.  Pertinent labs & imaging results that were available during my care of the patient were reviewed by me and considered in my medical decision making (see chart for details).     I discussed this patient's complaints and my exam findings with Dr. Demaris Callander.  I agree with recommended plan to treat patient empirically for presumed diverticulitis with ciprofloxacin and metronidazole as well as to check CBC with differential to look for signs of acute infection.  Patient verbalized understanding and agreement of plan as discussed.  All questions were addressed during visit.  Please see discharge instructions below for further details of plan of care.  Final Clinical Impressions(s) / UC Diagnoses   Final diagnoses:  Abdominal pain, left lower quadrant     Discharge Instructions      After discussing your concerns with my supervising physician, he has recommended that you begin taking 2 antibiotics, ciprofloxacin and metronidazole, to treat you for presumed intra abdominal infection such as diverticulitis.  We will also check a CBC today to evaluate for an elevated white blood cell count.  If your white blood cell count is elevated, you will need to go as soon as possible to the emergency room to have a CT scan done of your abdomen.  If you do not have improvement of your symptoms once you complete 7 days of these 2 antibiotics, you will need to go as soon as possible to the emergency room to have a CT scan done of your abdomen.  If you have worsening symptoms while you are taking these 2 antibiotics, you will need to go as soon as possible to the emergency room to have a CT scan done of your abdomen.  If your symptoms improve with these 2 antibiotics, it is recommended that you follow-up with either  primary care or gastroenterologist to discuss the episode and pursue further testing, if needed.  Thank you for visiting urgent care today, I do hope you feel better with this treatment.     ED Prescriptions     Medication Sig Dispense Auth. Provider   metroNIDAZOLE (FLAGYL) 500 MG tablet Take 1 tablet (500 mg total) by mouth 3 (three) times daily for 10 days. 30 tablet Theadora Rama Scales, PA-C   ciprofloxacin (CIPRO) 500 MG tablet Take 1 tablet (500 mg total) by mouth 2 (two) times daily for 10 days. 20 tablet Theadora Rama Scales, PA-C      PDMP not reviewed this encounter.   Theadora Rama Scales, PA-C 07/30/21 1442

## 2021-07-31 LAB — CBC WITH DIFFERENTIAL/PLATELET
Basophils Absolute: 0.1 10*3/uL (ref 0.0–0.2)
Basos: 0 %
EOS (ABSOLUTE): 0 10*3/uL (ref 0.0–0.4)
Eos: 0 %
Hematocrit: 40.7 % (ref 34.0–46.6)
Hemoglobin: 13.7 g/dL (ref 11.1–15.9)
Immature Grans (Abs): 0.1 10*3/uL (ref 0.0–0.1)
Immature Granulocytes: 1 %
Lymphocytes Absolute: 1.9 10*3/uL (ref 0.7–3.1)
Lymphs: 12 %
MCH: 30.4 pg (ref 26.6–33.0)
MCHC: 33.7 g/dL (ref 31.5–35.7)
MCV: 90 fL (ref 79–97)
Monocytes Absolute: 1.1 10*3/uL — ABNORMAL HIGH (ref 0.1–0.9)
Monocytes: 7 %
Neutrophils Absolute: 12.5 10*3/uL — ABNORMAL HIGH (ref 1.4–7.0)
Neutrophils: 80 %
Platelets: 343 10*3/uL (ref 150–450)
RBC: 4.51 x10E6/uL (ref 3.77–5.28)
RDW: 11.9 % (ref 11.7–15.4)
WBC: 15.6 10*3/uL — ABNORMAL HIGH (ref 3.4–10.8)

## 2021-12-17 ENCOUNTER — Other Ambulatory Visit: Payer: Self-pay | Admitting: Gastroenterology

## 2022-02-22 ENCOUNTER — Encounter (HOSPITAL_COMMUNITY): Payer: Self-pay | Admitting: Gastroenterology

## 2022-03-01 ENCOUNTER — Ambulatory Visit (HOSPITAL_COMMUNITY): Admission: RE | Admit: 2022-03-01 | Payer: 59 | Source: Home / Self Care | Admitting: Gastroenterology

## 2022-03-01 SURGERY — UPPER ESOPHAGEAL ENDOSCOPIC ULTRASOUND (EUS)
Anesthesia: Monitor Anesthesia Care
